# Patient Record
Sex: Male | Born: 1940 | Race: White
Health system: Southern US, Community
[De-identification: ages and names within clinical notes are randomized; demographics above are authoritative.]

## PROBLEM LIST (undated history)

## (undated) DIAGNOSIS — I447 Left bundle-branch block, unspecified: Secondary | ICD-10-CM

## (undated) DIAGNOSIS — E785 Hyperlipidemia, unspecified: Secondary | ICD-10-CM

## (undated) DIAGNOSIS — J40 Bronchitis, not specified as acute or chronic: Secondary | ICD-10-CM

## (undated) DIAGNOSIS — M545 Low back pain, unspecified: Secondary | ICD-10-CM

## (undated) DIAGNOSIS — K219 Gastro-esophageal reflux disease without esophagitis: Secondary | ICD-10-CM

## (undated) DIAGNOSIS — J302 Other seasonal allergic rhinitis: Secondary | ICD-10-CM

## (undated) DIAGNOSIS — I10 Essential (primary) hypertension: Secondary | ICD-10-CM

## (undated) DIAGNOSIS — K52831 Collagenous colitis: Secondary | ICD-10-CM

## (undated) DIAGNOSIS — B269 Mumps without complication: Secondary | ICD-10-CM

## (undated) DIAGNOSIS — Z9289 Personal history of other medical treatment: Secondary | ICD-10-CM

## (undated) DIAGNOSIS — E039 Hypothyroidism, unspecified: Secondary | ICD-10-CM

## (undated) DIAGNOSIS — M47816 Spondylosis without myelopathy or radiculopathy, lumbar region: Secondary | ICD-10-CM

## (undated) DIAGNOSIS — I429 Cardiomyopathy, unspecified: Secondary | ICD-10-CM

## (undated) DIAGNOSIS — E559 Vitamin D deficiency, unspecified: Secondary | ICD-10-CM

## (undated) DIAGNOSIS — F40240 Claustrophobia: Secondary | ICD-10-CM

## (undated) HISTORY — DX: Collagenous colitis: K52.831

## (undated) HISTORY — DX: Hyperlipidemia, unspecified: E78.5

## (undated) HISTORY — DX: Left bundle-branch block, unspecified: I44.7

## (undated) HISTORY — DX: Essential (primary) hypertension: I10

## (undated) HISTORY — DX: Mumps without complication: B26.9

## (undated) HISTORY — DX: Bronchitis, not specified as acute or chronic: J40

## (undated) HISTORY — DX: Personal history of other medical treatment: Z92.89

## (undated) HISTORY — DX: Other seasonal allergic rhinitis: J30.2

## (undated) HISTORY — DX: Gastro-esophageal reflux disease without esophagitis: K21.9

## (undated) HISTORY — DX: Hypothyroidism, unspecified: E03.9

## (undated) HISTORY — DX: Vitamin D deficiency, unspecified: E55.9

## (undated) HISTORY — DX: Cardiomyopathy, unspecified: I42.9

---
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
## 1994-03-07 ENCOUNTER — Ambulatory Visit: Admit: 1994-03-07 | Disposition: A | Discharge status: Home / Self Care | Payer: Self-pay | Admitting: Internal Medicine

## 1994-03-07 ENCOUNTER — Other Ambulatory Visit: Payer: Self-pay

## 1995-09-28 ENCOUNTER — Ambulatory Visit: Admit: 1995-09-28 | Disposition: A | Discharge status: Home / Self Care | Payer: Self-pay | Admitting: Family Medicine

## 1995-10-01 ENCOUNTER — Ambulatory Visit: Admit: 1995-10-01 | Disposition: A | Discharge status: Home / Self Care | Payer: Self-pay | Admitting: Internal Medicine

## 1996-01-04 ENCOUNTER — Ambulatory Visit: Admit: 1996-01-04 | Disposition: A | Discharge status: Home / Self Care | Payer: Self-pay | Admitting: Family Medicine

## 1996-03-12 ENCOUNTER — Ambulatory Visit: Admit: 1996-03-12 | Disposition: A | Discharge status: Home / Self Care | Payer: Self-pay | Admitting: Family Medicine

## 1996-03-20 ENCOUNTER — Ambulatory Visit
Admit: 1996-03-20 | Disposition: A | Discharge status: Home / Self Care | Payer: Self-pay | Admitting: Physical Medicine & Rehabilitation

## 1996-07-29 ENCOUNTER — Ambulatory Visit: Admit: 1996-07-29 | Disposition: A | Discharge status: Home / Self Care | Payer: Self-pay

## 1997-01-01 ENCOUNTER — Ambulatory Visit: Admit: 1997-01-01 | Disposition: A | Discharge status: Home / Self Care | Payer: Self-pay

## 1997-03-16 ENCOUNTER — Ambulatory Visit
Admit: 1997-03-16 | Disposition: A | Discharge status: Home / Self Care | Payer: Self-pay | Admitting: Physical Medicine & Rehabilitation

## 2001-03-31 ENCOUNTER — Ambulatory Visit
Admit: 2001-03-31 | Disposition: A | Discharge status: Home / Self Care | Payer: Self-pay | Admitting: Physical Medicine & Rehabilitation

## 2010-09-05 HISTORY — PX: CARDIAC CATHETERIZATION: SHX172

## 2010-09-12 ENCOUNTER — Ambulatory Visit
Admission: RE | Admit: 2010-09-12 | Discharge: 2010-09-12 | Disposition: A | Discharge status: Home / Self Care | Payer: Self-pay | Source: Ambulatory Visit | Attending: Internal Medicine | Admitting: Internal Medicine

## 2011-03-24 NOTE — Op Note (Signed)
Wesley [name], Wesley [name]      MRN:          [id number]      Account:      [id number]      Document ID:  [id number]      Procedure Date: 09/12/2010            Admit Date: 09/12/2010      Order #:            Patient Location: DISCHARGED 09/12/2010      Patient Type: A            ATTENDING PHYSICIAN: Orland Dec, MD            PROCEDURE PERFORMED BY: Delton Coombes MD                  REFERRING PHYSICIAN:      Torrie Mayers MD            TITLE OF PROCEDURE:      Left heart catheterization, selective coronary arteriography, and left      ventriculography.            HISTORY:      The patient is a 70 year old male with history of hypertension and      hyperlipidemia, who came to me for a second opinion.  The patient had a      stress thallium study done a few months ago, which was reported to be      abnormal, showing ischemia involving the anterior and apical wall.  The      patient was advised to undergo cardiac catheterization, but he was      reluctant.  Thereafter, he came to me for evaluation.  The patient is not      having any discomfort, and so he was worried about having the procedure.      His initial EKG did not show any significant changes.  I explained to him      that the stress thallium was not definitive, and it could be false      positive.  Thereafter, he was advised to be on medications including a beta      blocker, aspirin, and antiplatelet agents.  The patient then came back for      followup and complained of having feeling fatigued.  His new EKG was no      change and different than his previous EKG, and showed new left bundle      branch block.  It was therefore decided to proceed with cardiac      catheterization to rule out coronary artery disease.  Risks and benefits of      the procedure were explained to the patient in detail.  He understands and      is willing to proceed.            DESCRIPTION OF PROCEDURE:      Informed consent was obtained.  The patient was brought to the      catheterization lab  and the right groin area was prepped and draped in the      usual sterile fashion.  Local anesthesia was achieved using 6 mL of 1%      Xylocaine.  A 4-French hemostasis sheath was inserted in the right femoral      artery using the modified Seldinger technique.  Selective coronary      arteriography of the left and right coronary arteries was done using  4-French JL4 and 3DRC catheters, respectively.  Left ventriculography was                                   Page 1 of 3      Wesley [name], Wesley [name]      MRN:          [id number]      Account:      [id number]      Document ID:  [id number]      Procedure Date: 09/12/2010            done using 4-French pigtail catheter and 30 mL of contrast.  Pullback      pressure across the aorta was noted.  The patient tolerated the procedure      very well.  He was given Versed and fentanyl for sedation.  At the end of      the procedure, the sheath was removed and hemostasis was achieved with the      application of manual pressure for about 20 minutes.  The patient was      transferred to the room in stable condition.            FINDINGS:      LMCA:  The left main coronary artery gives rise to the LAD and the      circumflex coronary artery in the usual fashion.  The left main is free of      any disease.            LAD:  The left anterior descending coronary artery gives rise to 3 diagonal      branches and 2 septal perforators before ending as a recurrent apical      branch.  The LAD system is also free of any significant disease.            CIRCUMFLEX:  The circumflex coronary artery gives rise to 2 obtuse marginal      branches and then continues as the AV groove vessel.  The circumflex system      is also free of any disease.            RCA:  The right coronary artery gives rise to an RV marginal branch, a PDA,      and a posterolateral branch.  The right coronary artery is also free of any      disease.            LEFT VENTRICULOGRAPHY:      Shows mild hypokinesis of the  anterior and apical wall, otherwise      well-preserved overall left ventricular systolic function.  EF is      approximately 40% to 45%.  No gradient is seen across the aortic valve.            CONCLUSIONS:      1.  Near normal coronary arteries.      2.  Mild left ventricular systolic dysfunction with inferior atrial wall      hypokinesis.  EF approximately 40% to 45%.            COMMENTS:      The patient does not have any coronary artery disease to account for his      symptoms.  His thallium results are false positive and may be due to left      bundle branch block.  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  This could be a result of viral infection, drug-induced or      idiopathic.  Also, this may have caused him to the develop left bundle      branch block.  Obviously he does not have any concern, since the patient is      not having any symptoms.  I would suggest to continue monitoring him.      Also, would suggest to continue treating his blood pressure and cholesterol      as before.                                   Page 2 of 3      Wesley [name], Wesley [name]      MRN:          [id number]      Account:      [id number]      Document ID:  [id number]      Procedure Date: 09/12/2010                  Thank you very much for asking me to evaluate this patient.                        Electronic Signing Provider            D:  09/13/2010 23:24 PM by Dr. Kathie [place] ([id number])      T:  09/13/2010 23:39 PM by [id number]                  GU:[email or web address] Everardo All MD                                   Page 3 of 3      Authenticated by Joya Gaskins, MD ([id number]) On 10/20/2010 10:20:06 AM

## 2014-02-16 ENCOUNTER — Encounter (INDEPENDENT_AMBULATORY_CARE_PROVIDER_SITE_OTHER): Payer: Self-pay | Admitting: Internal Medicine

## 2014-02-17 ENCOUNTER — Ambulatory Visit (INDEPENDENT_AMBULATORY_CARE_PROVIDER_SITE_OTHER): Payer: Medicare Other | Admitting: Internal Medicine

## 2014-02-17 VITALS — BP 156/82 | HR 92 | Ht 68.0 in | Wt 157.0 lb

## 2014-02-17 DIAGNOSIS — I447 Left bundle-branch block, unspecified: Secondary | ICD-10-CM

## 2014-02-17 NOTE — Progress Notes (Signed)
[facility] NEW OFFICE CONSULTATION      Referring: Torrie Mayers, MD  HPI:   Today, I had the pleasure of seeing Wesley [name] who is a 73 year old man  with a history of LBBB to approximately 2011.  He underwent cardiac  catheterization in 2012 due to questionable stress test.  His cardiac  catheterization was done at [facility] by Dr. Johnette Abraham and was within  normal limits.  There was a question about whether or not he had ischemia.   In any event, his catheterization showed coronary artery disease but LVEF  of approximately 55%.  He had other imaging modalities including an echo  1 year before, which was within normal limits.  He is very active.  He does  aerobics for half an hour to an hour essentially every day.  Furthermore,  he lifts weights and is involved in [employer] strength program on a regular  basis, except not at the current time as he hurt his right arm.  No  dizziness, lightheadedness, syncope.  No chest pain, arm pain, neck pain,  back pain.  No PND, orthopnea, or peripheral edema.     His electrocardiogram today shows sinus rhythm, left bundle branch block,  QRS 140 duration of 140 msec.          Assessment and Plan:  In summary, this 73 year old male with hyperlipidemia.  He has demonstrated  a left bundle branch block QRS on his electrocardiogram which is  approximately 72 years old.  I would recommend no specific intervention.  He  did have an LV on his angiogram of about 45% although I suspect that  that was due to it being a left ventriculogram with the contrast   reduced LV function.  My thought is that he should get a 2D echocardiogram  and return to see me in 1 month's time.  We will review those results and  decide what to do going forward.  My own thought is that he will not need  anything else.          PMH: No past medical history on file.     MEDICATIONS: He has a current medication list which includes the following prescription(s): atorvastatin - Take 20 mg by mouth daily, vitamin d  - Take by mouth, co q 10 - Take by mouth, diphenoxylate-atropine - Take 1 tablet by mouth 4 (four) times daily as needed for Diarrhea, ezetimibe - Take 10 mg by mouth daily, levothyroxine - Take 125 mcg by mouth Once a day at 6:00am, loperamide - Take 2 mg by mouth 4 (four) times daily as needed for Diarrhea, olmesartan - Take 10 mg by mouth daily. 4 days a week, olmesartan-hydrochlorothiazide - Take 0.5 tablets by mouth daily. 3 days a week, and testosterone - Place 5 g onto the skin 2 (two) times daily.    Current Outpatient Prescriptions   Medication Sig Dispense Refill   . atorvastatin (LIPITOR) 20 MG tablet Take 20 mg by mouth daily.     . Cholecalciferol (VITAMIN D) 2000 UNITS Cap Take by mouth.     . Coenzyme Q10 (CO Q 10) 10 MG Cap Take by mouth.     . diphenoxylate-atropine (LOMOTIL) 2.5-0.025 MG per tablet Take 1 tablet by mouth 4 (four) times daily as needed for Diarrhea.     . ezetimibe (ZETIA) 10 MG tablet Take 10 mg by mouth daily.     Marland Kitchen levothyroxine (LEVOXYL) 125 MCG tablet Take 125 mcg by mouth Once a  day at 6:00am.     . loperamide (IMODIUM) 2 MG capsule Take 2 mg by mouth 4 (four) times daily as needed for Diarrhea.     . olmesartan (BENICAR) 5 MG tablet Take 10 mg by mouth daily. 4 days a week.     . olmesartan-hydrochlorothiazide (BENICAR HCT) 20-12.5 MG per tablet Take 0.5 tablets by mouth daily. 3 days a week.     . testosterone (ANDROGEL) 50 MG/5GM (1%) Gel Place 5 g onto the skin 2 (two) times daily.       No current facility-administered medications for this visit.        SH:   History   Substance Use Topics   . Smoking status: Not on file   . Smokeless tobacco: Not on file   . Alcohol Use: Not on file       FH: no family history of sudden death    REVIEW OF SYSTEMS: All other systems reviewed and negative except as above.    PHYSICAL EXAMINATION  General Appearance: A well-appearing male in no acute distress.   Vital Signs: BP 156/82 mmHg  Pulse 92  Ht 1.727 m (5\' 8" )  Wt 71.215 kg (157  lb)  BMI 23.88 kg/m2   HEENT: Sclera anicteric, conjunctiva without pallor, moist mucous membranes, normal dentition.   Neck: Supple. Full range of motion.  Chest: Clear to auscultation. No wheezes, rales, or rhonchi  Cardiovascular: Normal S1 and S2 without murmurs, rub, or gallops.No edema. Pulses 2+ and equal. JVP <10 cm H2O  Abdomen: Soft, nontender. Nl bowel sounds  Extremities: Anicteric  Skin: Warm and dry  Neuro: Alert and oriented x3. Grossly intact. Strength is symmetric and moves all 4. Normal mood and affect.     ECG: Nl sinus rhythm. LBBB.    Cardiac Diagnostics:      Thanks.    ----------------------------  Edward Qualia, MD  [facility] - Arrhythmia   Office: [phone]

## 2014-02-23 ENCOUNTER — Encounter (INDEPENDENT_AMBULATORY_CARE_PROVIDER_SITE_OTHER): Payer: Self-pay | Admitting: Internal Medicine

## 2014-03-24 ENCOUNTER — Ambulatory Visit (INDEPENDENT_AMBULATORY_CARE_PROVIDER_SITE_OTHER): Payer: Medicare Other | Admitting: Internal Medicine

## 2015-03-22 DIAGNOSIS — E7800 Pure hypercholesterolemia, unspecified: Secondary | ICD-10-CM | POA: Insufficient documentation

## 2015-03-22 DIAGNOSIS — I10 Essential (primary) hypertension: Secondary | ICD-10-CM | POA: Insufficient documentation

## 2015-03-22 DIAGNOSIS — E039 Hypothyroidism, unspecified: Secondary | ICD-10-CM | POA: Insufficient documentation

## 2015-03-23 ENCOUNTER — Ambulatory Visit (INDEPENDENT_AMBULATORY_CARE_PROVIDER_SITE_OTHER): Payer: Self-pay | Admitting: Cardiology

## 2015-03-23 DIAGNOSIS — I425 Other restrictive cardiomyopathy: Secondary | ICD-10-CM | POA: Insufficient documentation

## 2015-03-23 DIAGNOSIS — I447 Left bundle-branch block, unspecified: Secondary | ICD-10-CM | POA: Insufficient documentation

## 2015-04-06 ENCOUNTER — Other Ambulatory Visit (INDEPENDENT_AMBULATORY_CARE_PROVIDER_SITE_OTHER): Payer: Self-pay

## 2015-04-06 ENCOUNTER — Ambulatory Visit (INDEPENDENT_AMBULATORY_CARE_PROVIDER_SITE_OTHER): Payer: Self-pay

## 2015-07-14 ENCOUNTER — Other Ambulatory Visit: Payer: Self-pay

## 2015-07-25 LAB — VAHRT HISTORIC LVEF: Ejection Fraction: 60 %

## 2015-07-26 ENCOUNTER — Ambulatory Visit (INDEPENDENT_AMBULATORY_CARE_PROVIDER_SITE_OTHER): Payer: Self-pay | Admitting: Cardiology

## 2015-07-28 DIAGNOSIS — M1712 Unilateral primary osteoarthritis, left knee: Secondary | ICD-10-CM | POA: Insufficient documentation

## 2015-07-28 DIAGNOSIS — M5417 Radiculopathy, lumbosacral region: Secondary | ICD-10-CM | POA: Insufficient documentation

## 2015-07-28 DIAGNOSIS — S76219A Strain of adductor muscle, fascia and tendon of unspecified thigh, initial encounter: Secondary | ICD-10-CM | POA: Insufficient documentation

## 2016-10-31 DIAGNOSIS — M25552 Pain in left hip: Secondary | ICD-10-CM | POA: Insufficient documentation

## 2016-10-31 DIAGNOSIS — E063 Autoimmune thyroiditis: Secondary | ICD-10-CM | POA: Insufficient documentation

## 2016-10-31 DIAGNOSIS — I10 Essential (primary) hypertension: Secondary | ICD-10-CM | POA: Insufficient documentation

## 2016-10-31 DIAGNOSIS — E785 Hyperlipidemia, unspecified: Secondary | ICD-10-CM | POA: Insufficient documentation

## 2016-10-31 DIAGNOSIS — F40240 Claustrophobia: Secondary | ICD-10-CM | POA: Insufficient documentation

## 2016-10-31 DIAGNOSIS — M48061 Spinal stenosis, lumbar region without neurogenic claudication: Secondary | ICD-10-CM | POA: Insufficient documentation

## 2016-11-02 DIAGNOSIS — M47816 Spondylosis without myelopathy or radiculopathy, lumbar region: Secondary | ICD-10-CM | POA: Insufficient documentation

## 2016-11-02 DIAGNOSIS — K219 Gastro-esophageal reflux disease without esophagitis: Secondary | ICD-10-CM | POA: Insufficient documentation

## 2016-11-06 ENCOUNTER — Ambulatory Visit (INDEPENDENT_AMBULATORY_CARE_PROVIDER_SITE_OTHER): Payer: Self-pay | Admitting: Cardiology

## 2017-06-04 ENCOUNTER — Other Ambulatory Visit (INDEPENDENT_AMBULATORY_CARE_PROVIDER_SITE_OTHER): Payer: Self-pay

## 2017-06-04 ENCOUNTER — Ambulatory Visit (INDEPENDENT_AMBULATORY_CARE_PROVIDER_SITE_OTHER): Payer: Self-pay

## 2017-06-10 LAB — VAHRT HISTORIC LVEF

## 2017-06-11 ENCOUNTER — Ambulatory Visit (INDEPENDENT_AMBULATORY_CARE_PROVIDER_SITE_OTHER): Payer: Self-pay | Admitting: Cardiology

## 2017-06-21 HISTORY — PX: COLONOSCOPY: SHX174

## 2017-06-28 ENCOUNTER — Ambulatory Visit (INDEPENDENT_AMBULATORY_CARE_PROVIDER_SITE_OTHER): Payer: Self-pay | Admitting: Physician Assistant

## 2017-07-02 DIAGNOSIS — R0781 Pleurodynia: Secondary | ICD-10-CM | POA: Insufficient documentation

## 2017-07-03 DIAGNOSIS — I429 Cardiomyopathy, unspecified: Secondary | ICD-10-CM | POA: Insufficient documentation

## 2017-07-03 DIAGNOSIS — I2510 Atherosclerotic heart disease of native coronary artery without angina pectoris: Secondary | ICD-10-CM | POA: Insufficient documentation

## 2017-07-12 ENCOUNTER — Ambulatory Visit (INDEPENDENT_AMBULATORY_CARE_PROVIDER_SITE_OTHER): Payer: Self-pay | Admitting: Physician Assistant

## 2017-10-08 ENCOUNTER — Other Ambulatory Visit (INDEPENDENT_AMBULATORY_CARE_PROVIDER_SITE_OTHER): Payer: Self-pay

## 2017-10-08 ENCOUNTER — Ambulatory Visit (INDEPENDENT_AMBULATORY_CARE_PROVIDER_SITE_OTHER): Payer: Self-pay

## 2017-10-22 DIAGNOSIS — K52831 Collagenous colitis: Secondary | ICD-10-CM | POA: Insufficient documentation

## 2017-11-05 LAB — VAHRT HISTORIC LVEF: Ejection Fraction: 50 %

## 2017-11-06 ENCOUNTER — Ambulatory Visit (INDEPENDENT_AMBULATORY_CARE_PROVIDER_SITE_OTHER): Payer: Self-pay | Admitting: Cardiology

## 2017-11-29 DIAGNOSIS — R110 Nausea: Secondary | ICD-10-CM | POA: Insufficient documentation

## 2017-11-29 DIAGNOSIS — M542 Cervicalgia: Secondary | ICD-10-CM | POA: Insufficient documentation

## 2018-01-20 DIAGNOSIS — E559 Vitamin D deficiency, unspecified: Secondary | ICD-10-CM | POA: Insufficient documentation

## 2018-04-16 DIAGNOSIS — F419 Anxiety disorder, unspecified: Secondary | ICD-10-CM | POA: Insufficient documentation

## 2018-04-16 DIAGNOSIS — M25511 Pain in right shoulder: Secondary | ICD-10-CM | POA: Insufficient documentation

## 2018-06-02 DIAGNOSIS — Z6825 Body mass index (BMI) 25.0-25.9, adult: Secondary | ICD-10-CM | POA: Insufficient documentation

## 2018-09-19 ENCOUNTER — Ambulatory Visit (INDEPENDENT_AMBULATORY_CARE_PROVIDER_SITE_OTHER): Payer: Self-pay

## 2018-09-19 ENCOUNTER — Other Ambulatory Visit (INDEPENDENT_AMBULATORY_CARE_PROVIDER_SITE_OTHER): Payer: Self-pay

## 2018-10-31 HISTORY — PX: EGD: SHX3789

## 2018-11-13 LAB — VAHRT HISTORIC LVEF: Ejection Fraction: 42 %

## 2018-11-14 ENCOUNTER — Ambulatory Visit (INDEPENDENT_AMBULATORY_CARE_PROVIDER_SITE_OTHER): Payer: Self-pay | Admitting: Cardiology

## 2018-11-26 ENCOUNTER — Encounter (INDEPENDENT_AMBULATORY_CARE_PROVIDER_SITE_OTHER): Payer: Self-pay | Admitting: Family Medicine

## 2018-12-05 ENCOUNTER — Encounter (INDEPENDENT_AMBULATORY_CARE_PROVIDER_SITE_OTHER): Payer: Self-pay

## 2018-12-25 ENCOUNTER — Encounter (INDEPENDENT_AMBULATORY_CARE_PROVIDER_SITE_OTHER): Payer: Self-pay | Admitting: Family Medicine

## 2018-12-26 ENCOUNTER — Telehealth (INDEPENDENT_AMBULATORY_CARE_PROVIDER_SITE_OTHER): Payer: Self-pay | Admitting: Family Medicine

## 2018-12-26 DIAGNOSIS — R7309 Other abnormal glucose: Secondary | ICD-10-CM

## 2018-12-26 DIAGNOSIS — D1803 Hemangioma of intra-abdominal structures: Secondary | ICD-10-CM | POA: Insufficient documentation

## 2018-12-26 DIAGNOSIS — M5090 Cervical disc disorder, unspecified, unspecified cervical region: Secondary | ICD-10-CM | POA: Insufficient documentation

## 2018-12-26 DIAGNOSIS — M79659 Pain in unspecified thigh: Secondary | ICD-10-CM | POA: Insufficient documentation

## 2018-12-26 DIAGNOSIS — M79603 Pain in arm, unspecified: Secondary | ICD-10-CM | POA: Insufficient documentation

## 2018-12-26 DIAGNOSIS — S76209A Unspecified injury of adductor muscle, fascia and tendon of unspecified thigh, initial encounter: Secondary | ICD-10-CM | POA: Insufficient documentation

## 2018-12-26 NOTE — Telephone Encounter (Signed)
Labs from 11/24/2018    Your cholesterol is in excellent range with LDL bad cholesterol and triglycerides values at goal and normal HDL good cholesterol.    Sodium, potassium, electrolytes, calcium,  kidney , liver normal.    Your creatine kinase muscle enzyme is in normal range.    Your TSH thyroid hormone levels are in normal range.    Your vitamin B12 level  is in normal range.    Glucose shows possible prediabetes, recheck fasting lab visit then office visit in 3 months.  I recommend reducing sugar, carb, sweets, white bread, pasta, rice in your diet. Please get regular exercise, at least 60 minutes a week of cardio including faster pace walking, running, cycling, or swimming. Can come back for lab visit or check at next visit, Hemoglobin A1C test which is to see how well sugar is controlled over last 3 months.

## 2019-01-14 NOTE — Telephone Encounter (Signed)
S/w pt and pt stated that he already received results.

## 2019-01-15 ENCOUNTER — Encounter (INDEPENDENT_AMBULATORY_CARE_PROVIDER_SITE_OTHER): Payer: Self-pay

## 2019-02-06 ENCOUNTER — Encounter (INDEPENDENT_AMBULATORY_CARE_PROVIDER_SITE_OTHER): Payer: Self-pay | Admitting: Family Medicine

## 2019-02-17 ENCOUNTER — Encounter (INDEPENDENT_AMBULATORY_CARE_PROVIDER_SITE_OTHER): Payer: Self-pay

## 2019-03-02 ENCOUNTER — Telehealth (INDEPENDENT_AMBULATORY_CARE_PROVIDER_SITE_OTHER): Payer: Self-pay | Admitting: Family Medicine

## 2019-03-02 NOTE — Telephone Encounter (Signed)
Pt came by to ask if you would re do the Urology referral so that it has Dr.Amer Al Juburi listed as the Urologist. Pt is anxious to make appointment.

## 2019-03-02 NOTE — Telephone Encounter (Signed)
For what diagnosis?

## 2019-03-04 NOTE — Telephone Encounter (Signed)
Please obtain information needed for refill

## 2019-03-17 ENCOUNTER — Encounter (INDEPENDENT_AMBULATORY_CARE_PROVIDER_SITE_OTHER): Payer: Self-pay | Admitting: Family Medicine

## 2019-04-10 ENCOUNTER — Ambulatory Visit (INDEPENDENT_AMBULATORY_CARE_PROVIDER_SITE_OTHER): Payer: Medicare Other | Admitting: Family Medicine

## 2019-04-12 ENCOUNTER — Encounter (INDEPENDENT_AMBULATORY_CARE_PROVIDER_SITE_OTHER): Payer: Self-pay

## 2019-04-12 NOTE — Progress Notes (Signed)
[facility]                       Date of Exam: 04/13/2019 1:43 PM        Patient ID: Wesley [name] is a 78 y.o. male.  Attending Physician: Maebelle Munroe, DO        Chief Complaint:    Chief Complaint   Patient presents with   . Hypothyroidism   . Hypertension               HPI:    Pt presents today for ov  6 month f/u   Discuss hemidrosis and would like cream   Bp at home 110's/70's      Here complaining of hemorrhoids, does have blood when wiping at times.  Will be seeing GI soon again.  colonoscopy UTD last year pt believes.  Had recent rectal exam by urology as well.      Pt also following up on htn, bp runs 120-130/60-70s at home.  Does well with watching salt in diet.  Exercising a few times a week, no chest pain/sob.  On coreg, benicar and benicar hctz by cardiology.      Pt also has high cholesterol, on zetia and liptor without muscle aches.  Watches fat in diet well.    Pt also follow up on anxiety, uses xanax less than once a week but does feel jumpy at times with possible tremor.  Pt will see his neurologist soon.            Problem List:    Patient Active Problem List   Diagnosis   . LBBB (left bundle branch block)   . Anxiety   . Benign essential hypertension   . Cervical disc disorder   . Claustrophobia   . Hemangioma of liver   . Hyperlipidemia LDL goal <70   . Other specified hypothyroidism   . Injury of adductor muscle and tendon of thigh   . Nausea   . Neck pain   . Pain in joint of right shoulder   . Pain in upper limb   . Thigh pain   . Vitamin D deficiency   . Abnormal glucose   . Cardiomyopathy   . Collagenous colitis   . Costochondritis   . Degenerative lumbar spinal stenosis   . Gastroesophageal reflux disease without esophagitis   . Left hip pain   . Localized osteoarthritis of left knee   . Nonobstructive atherosclerosis of coronary artery   . Spondylosis of lumbar spine   . Strain of adductor magnus muscle   . Lumbosacral radiculopathy   .  Tremor of both hands   . Other specified anemias   . Tinea pedis of right foot   . Chronic pain of left knee   . Upper respiratory tract infection, unspecified type   . Other hemorrhoids             Current Meds:    Outpatient Medications Marked as Taking for the 04/13/19 encounter (Office Visit) with Maebelle Munroe, DO   Medication Sig Dispense Refill   . carvedilol (Coreg) 6.25 MG tablet Coreg 6.25 mg tablet   Take 1 tablet twice a day by oral route.     . Cholecalciferol (VITAMIN D) 2000 UNITS Cap Take by mouth.     . Coenzyme Q10 (CO Q 10) 10 MG Cap Take by mouth.     . diclofenac (VOLTAREN) 75  MG EC tablet diclofenac sodium 75 mg tablet,delayed release     . diphenoxylate-atropine (LOMOTIL) 2.5-0.025 MG per tablet Take 1 tablet by mouth 4 (four) times daily as needed for Diarrhea.     . olmesartan (BENICAR) 20 MG tablet Take 1 tablet (20 mg total) by mouth daily 4 days a week. 30 tablet 0   . olmesartan-hydrochlorothiazide (BENICAR HCT) 20-12.5 MG per tablet Take 1 tablet by mouth daily 3 days a week. 30 tablet 3   . ondansetron (ZOFRAN) 4 MG tablet every 8 (eight) hours     . RABEprazole (ACIPHEX) 20 MG tablet rabeprazole 20 mg tablet,delayed release     . testosterone (ANDROGEL) 50 MG/5GM (1%) Gel Place 5 g onto the skin 2 (two) times daily.     . [DISCONTINUED] ALPRAZolam (Xanax XR) 1 MG 24 hr tablet every 24 hours     . [DISCONTINUED] atorvastatin (LIPITOR) 20 MG tablet Take 20 mg by mouth daily.     . [DISCONTINUED] ezetimibe (ZETIA) 10 MG tablet Take 10 mg by mouth daily.     . [DISCONTINUED] levothyroxine (LEVOXYL) 125 MCG tablet Take 125 mcg by mouth Once a day at 6:00am.     . [DISCONTINUED] olmesartan (BENICAR) 5 MG tablet Take 10 mg by mouth daily. 4 days a week.     . [DISCONTINUED] olmesartan-hydrochlorothiazide (BENICAR HCT) 20-12.5 MG per tablet Take 0.5 tablets by mouth daily. 3 days a week.            Allergies:    No Known Allergies          Past Surgical History:    Past Surgical History:    Procedure Laterality Date   . COLONOSCOPY  06/21/2017   . EGD  10/31/2018    Dr. Rosalyn Charters- erythema   . TONSILLECTOMY  05/07/1943           Family History:    Family History   Problem Relation Age of Onset   . Migraines Mother    . Stomach cancer Sister         Died age 9   . Thyroid disease Sister 71   . Colon cancer Other         Cousin           Social History:    Social History     Tobacco Use   . Smoking status: Former Smoker     Packs/day: 1.00   . Smokeless tobacco: Never Used   Substance Use Topics   . Alcohol Use     Frequency: Never   . Drug use: Not on file          The following sections were reviewed this encounter by the provider:   Tobacco  Allergies  Meds  Problems  Med Hx  Surg Hx  Fam Hx             Vital Signs:    BP 140/80 (BP Site: Left arm, Patient Position: Sitting)   Pulse 78   Temp 97.1 F (36.2 C) (Temporal)   Resp 16   Ht 1.727 m (5\' 8" )   Wt 70.8 kg (156 lb)   SpO2 98%   BMI 23.72 kg/m          ROS:    Review of Systems   Constitutional: Negative for chills, diaphoresis, fatigue and fever.   HENT: Negative for congestion, ear pain, postnasal drip, rhinorrhea, sinus pressure and sore throat.    Eyes: Negative for photophobia,  discharge, redness and itching.   Respiratory: Negative for cough, chest tightness, shortness of breath and wheezing.    Cardiovascular: Negative for chest pain, palpitations and leg swelling.   Gastrointestinal: Negative for abdominal pain, blood in stool, constipation, diarrhea, nausea and vomiting.   Endocrine: Negative for cold intolerance, heat intolerance, polydipsia, polyphagia and polyuria.   Genitourinary: Negative for dysuria, flank pain, frequency, hematuria and urgency.   Musculoskeletal: Negative for arthralgias, back pain and myalgias.   Skin: Positive for rash (right foot). Negative for pallor.   Neurological: Positive for tremors. Negative for weakness, light-headedness, numbness and headaches.   Hematological: Negative for  adenopathy. Does not bruise/bleed easily.   Psychiatric/Behavioral: Negative for dysphoric mood, hallucinations, self-injury, sleep disturbance and suicidal ideas. The patient is nervous/anxious. The patient is not hyperactive.    All other systems reviewed and are negative.             Physical Exam:    Physical Exam  Vitals signs and nursing note reviewed.   Constitutional:       General: He is not in acute distress.     Appearance: Normal appearance. He is not ill-appearing, toxic-appearing or diaphoretic.   HENT:      Head: Normocephalic and atraumatic.   Eyes:      General: No scleral icterus.        Right eye: No discharge.         Left eye: No discharge.      Extraocular Movements: Extraocular movements intact.      Conjunctiva/sclera: Conjunctivae normal.      Pupils: Pupils are equal, round, and reactive to light.   Neck:      Musculoskeletal: Neck supple. No muscular tenderness.   Cardiovascular:      Rate and Rhythm: Normal rate and regular rhythm.      Heart sounds: Normal heart sounds. No murmur. No gallop.    Pulmonary:      Effort: Pulmonary effort is normal. No respiratory distress.      Breath sounds: Normal breath sounds. No wheezing or rales.   Chest:      Chest wall: No tenderness.   Abdominal:      General: Abdomen is flat. There is no distension.      Palpations: Abdomen is soft. There is no mass.      Tenderness: There is no abdominal tenderness. There is no right CVA tenderness, left CVA tenderness, guarding or rebound.      Hernia: No hernia is present.   Genitourinary:     Comments: No external hemorrhoid present, pt defer internal exam  Musculoskeletal:      Right lower leg: No edema.      Left lower leg: No edema.   Lymphadenopathy:      Cervical: No cervical adenopathy.   Skin:     Coloration: Skin is not pale.      Findings: Rash (macular dry scaly rash plantar aspect right foot and in between toes) present.   Neurological:      General: No focal deficit present.      Mental Status: He  is alert and oriented to person, place, and time.      Cranial Nerves: No cranial nerve deficit.      Motor: No weakness.      Comments: Mild resting tremor both hands   Psychiatric:         Mood and Affect: Mood normal.         Behavior: Behavior  normal.         Thought Content: Thought content normal.         Judgment: Judgment normal.              Assessment:    1. Other hemorrhoids  - hydrocortisone (Proctosol HC) 2.5 % rectal cream; Place rectally 2 (two) times daily  Dispense: 28 g; Refill: 1    2. Tremor of both hands    3. Benign essential hypertension    4. Hyperlipidemia LDL goal <70  - atorvastatin (LIPITOR) 20 MG tablet; Take 1 tablet (20 mg total) by mouth daily  Dispense: 90 tablet; Refill: 1  - ezetimibe (ZETIA) 10 MG tablet; Take 1 tablet (10 mg total) by mouth daily  Dispense: 90 tablet; Refill: 1  - Comprehensive metabolic panel; Future  - Lipid panel; Future  - Creatine Kinase (CK); Future    5. Anxiety  - ALPRAZolam (Xanax XR) 1 MG 24 hr tablet; Take 1 tablet (1 mg total) by mouth daily as needed (anxiety)  Dispense: 30 tablet; Refill: 0    6. Other specified hypothyroidism  - levothyroxine (Levoxyl) 125 MCG tablet; Take 1 tablet (125 mcg total) by mouth Once a day at 6:00am  Dispense: 90 tablet; Refill: 1  - TSH, Abn Reflex to Free T4, Serum; Future    7. Tinea pedis of right foot  - clotrimazole-betamethasone (LOTRISONE) cream; Apply to affected area 2 times daily  Dispense: 45 g; Refill: 1    8. Chronic pain of left knee  - lidocaine (Lidoderm) 5 %; Apply 1 patch for up to 12 hours a day, Remove & Discard patch within 12 hours  Dispense: 30 patch; Refill: 3    9. Nonobstructive atherosclerosis of coronary artery    10. Gastroesophageal reflux disease without esophagitis  - Magnesium; Future  - Vitamin B12; Future    11. Abnormal glucose  - Hemoglobin A1C; Future    12. Vitamin D deficiency  - Vitamin D,25 OH, Total; Future    13. Upper respiratory tract infection, unspecified type  -  SARS-CoV-2 Antibody, IgG; Future    14. Other specified anemias  - CBC and differential; Future  - IRON PROFILE; Future  - Ferritin; Future    15. Other general symptoms and signs   - Vitamin B12; Future    16. Abnormal finding of blood chemistry, unspecified   - IRON PROFILE; Future  - Ferritin; Future            Plan:      Other specified anemias  A/P: Will recheck labs, further management pending lab results.  Patient to continue to follow up with GI specialist.    Benign essential hypertension  A/P: slightly high today, continue current meds, f/u with cardiology    Hyperlipidemia LDL goal <70  A/P: pt will go to lab to get repeat labs done    Nonobstructive atherosclerosis of coronary artery  A/P: Stable, continue current treatment regimen.   Patient to continue to follow up with Cardiologist specialist.  Go to ER if increase chest pain or shortness of breath.    Upper respiratory tract infection, unspecified type  A/P: resolved.   Discussed with pt risk and benefits of Covid 19 antibody testing including limitations as to whether it may show immunity. Explained of symptoms to monitor for including fever, worsening cough, shortness of breath and abdominal symptoms including nausea and diarrhea. patient to go to ER if worsening symptoms.    Gastroesophageal reflux disease without  esophagitis  A/P: Patient to continue to follow up with GI specialist.    Other specified hypothyroidism  A/P: Will recheck labs, further management pending lab results.    Tinea pedis of right foot  A/P: treat with lotrisone    Abnormal glucose  A/P: Will recheck labs, further management pending lab results.    Anxiety  A/P: Stable, continue current treatment regimen.  Discussed with patient that medication can be addictive and is a controlled substance, patient understands and is agreeable to be careful with use.    Chronic pain of left knee  A/P: due to OA.  Stable, continue current treatment regimen.    Tremor of both hands  A/P: ?  Essential tremor.   Patient to continue to follow up with Neurologist Specialist.   Patient advised to go to ER if any neurologic changes including weakness, slur speech, worst headache of life or worsening symptoms.    Vitamin D deficiency  A/P: Patient advised that vitamin D test may not get covered by insurance. Benefits of vitamin d testing explained.    Other hemorrhoids  A/P: treat with proctosol.  Patient to continue to follow up with GI specialist.  Patient advised to go to ER if increasing abdominal pain, fever, vomiting, black stools or rectal bleeding.  Does have hx of blood in stool with wiping             Follow-up:    Return in about 4 months (around 08/12/2019), or if symptoms worsen or fail to improve, for HTN, High Cholesterol.         Maebelle Munroe, DO

## 2019-04-13 ENCOUNTER — Encounter (INDEPENDENT_AMBULATORY_CARE_PROVIDER_SITE_OTHER): Payer: Self-pay | Admitting: Family Medicine

## 2019-04-13 ENCOUNTER — Ambulatory Visit (INDEPENDENT_AMBULATORY_CARE_PROVIDER_SITE_OTHER): Payer: Medicare Other | Admitting: Family Medicine

## 2019-04-13 VITALS — BP 140/80 | HR 78 | Temp 97.1°F | Resp 16 | Ht 68.0 in | Wt 156.0 lb

## 2019-04-13 DIAGNOSIS — G8929 Other chronic pain: Secondary | ICD-10-CM

## 2019-04-13 DIAGNOSIS — E785 Hyperlipidemia, unspecified: Secondary | ICD-10-CM

## 2019-04-13 DIAGNOSIS — K219 Gastro-esophageal reflux disease without esophagitis: Secondary | ICD-10-CM

## 2019-04-13 DIAGNOSIS — K648 Other hemorrhoids: Secondary | ICD-10-CM | POA: Insufficient documentation

## 2019-04-13 DIAGNOSIS — F419 Anxiety disorder, unspecified: Secondary | ICD-10-CM

## 2019-04-13 DIAGNOSIS — D6489 Other specified anemias: Secondary | ICD-10-CM

## 2019-04-13 DIAGNOSIS — R6889 Other general symptoms and signs: Secondary | ICD-10-CM

## 2019-04-13 DIAGNOSIS — E038 Other specified hypothyroidism: Secondary | ICD-10-CM

## 2019-04-13 DIAGNOSIS — E559 Vitamin D deficiency, unspecified: Secondary | ICD-10-CM

## 2019-04-13 DIAGNOSIS — I10 Essential (primary) hypertension: Secondary | ICD-10-CM

## 2019-04-13 DIAGNOSIS — I2510 Atherosclerotic heart disease of native coronary artery without angina pectoris: Secondary | ICD-10-CM

## 2019-04-13 DIAGNOSIS — R7309 Other abnormal glucose: Secondary | ICD-10-CM

## 2019-04-13 DIAGNOSIS — R799 Abnormal finding of blood chemistry, unspecified: Secondary | ICD-10-CM

## 2019-04-13 DIAGNOSIS — R251 Tremor, unspecified: Secondary | ICD-10-CM | POA: Insufficient documentation

## 2019-04-13 DIAGNOSIS — M25562 Pain in left knee: Secondary | ICD-10-CM | POA: Insufficient documentation

## 2019-04-13 DIAGNOSIS — B353 Tinea pedis: Secondary | ICD-10-CM | POA: Insufficient documentation

## 2019-04-13 DIAGNOSIS — D509 Iron deficiency anemia, unspecified: Secondary | ICD-10-CM | POA: Insufficient documentation

## 2019-04-13 DIAGNOSIS — J069 Acute upper respiratory infection, unspecified: Secondary | ICD-10-CM | POA: Insufficient documentation

## 2019-04-13 MED ORDER — LEVOTHYROXINE SODIUM 125 MCG PO TABS
125.0000 ug | ORAL_TABLET | Freq: Every day | ORAL | 1 refills | Status: DC
Start: 2019-04-13 — End: 2019-06-05

## 2019-04-13 MED ORDER — ATORVASTATIN CALCIUM 20 MG PO TABS
20.0000 mg | ORAL_TABLET | Freq: Every day | ORAL | 1 refills | Status: DC
Start: 2019-04-13 — End: 2019-06-09

## 2019-04-13 MED ORDER — EZETIMIBE 10 MG PO TABS
10.0000 mg | ORAL_TABLET | Freq: Every day | ORAL | 1 refills | Status: DC
Start: 2019-04-13 — End: 2019-10-08

## 2019-04-13 MED ORDER — CLOTRIMAZOLE-BETAMETHASONE 1-0.05 % EX CREA
TOPICAL_CREAM | CUTANEOUS | 1 refills | Status: DC
Start: 2019-04-13 — End: 2019-06-05

## 2019-04-13 MED ORDER — OLMESARTAN MEDOXOMIL 20 MG PO TABS
20.0000 mg | ORAL_TABLET | Freq: Every day | ORAL | 0 refills | Status: DC
Start: 2019-04-13 — End: 2019-12-28

## 2019-04-13 MED ORDER — ALPRAZOLAM ER 1 MG PO TB24
1.00 mg | ORAL_TABLET | Freq: Every day | ORAL | 0 refills | Status: DC | PRN
Start: 2019-04-13 — End: 2019-06-05

## 2019-04-13 MED ORDER — LIDOCAINE 5 % EX PTCH
MEDICATED_PATCH | CUTANEOUS | 3 refills | Status: DC
Start: 2019-04-13 — End: 2019-11-02

## 2019-04-13 MED ORDER — ALPRAZOLAM ER 1 MG PO TB24
1.00 mg | ORAL_TABLET | Freq: Every day | ORAL | 0 refills | Status: DC | PRN
Start: 2019-04-13 — End: 2019-04-13

## 2019-04-13 MED ORDER — HYDROCORTISONE (PERIANAL) 2.5 % EX CREA
TOPICAL_CREAM | Freq: Two times a day (BID) | CUTANEOUS | 1 refills | Status: DC
Start: 2019-04-13 — End: 2019-06-05

## 2019-04-13 MED ORDER — OLMESARTAN MEDOXOMIL-HCTZ 20-12.5 MG PO TABS
1.0000 | ORAL_TABLET | Freq: Every day | ORAL | 3 refills | Status: DC
Start: 2019-04-13 — End: 2019-12-28

## 2019-04-13 NOTE — Assessment & Plan Note (Signed)
A/P: Stable, continue current treatment regimen.  Discussed with patient that medication can be addictive and is a controlled substance, patient understands and is agreeable to be careful with use.

## 2019-04-13 NOTE — Assessment & Plan Note (Signed)
A/P: resolved.   Discussed with pt risk and benefits of Covid 19 antibody testing including limitations as to whether it may show immunity. Explained of symptoms to monitor for including fever, worsening cough, shortness of breath and abdominal symptoms including nausea and diarrhea. patient to go to ER if worsening symptoms.

## 2019-04-13 NOTE — Assessment & Plan Note (Addendum)
A/P: Will recheck labs, further management pending lab results.  Patient to continue to follow up with GI specialist.

## 2019-04-13 NOTE — Assessment & Plan Note (Signed)
A/P: treat with lotrisone

## 2019-04-13 NOTE — Assessment & Plan Note (Signed)
A/P: due to OA.  Stable, continue current treatment regimen.

## 2019-04-13 NOTE — Assessment & Plan Note (Signed)
A/P: Patient advised that vitamin D test may not get covered by insurance. Benefits of vitamin d testing explained.

## 2019-04-13 NOTE — Assessment & Plan Note (Signed)
A/P: slightly high today, continue current meds, f/u with cardiology

## 2019-04-13 NOTE — Assessment & Plan Note (Signed)
A/P: Will recheck labs, further management pending lab results.

## 2019-04-13 NOTE — Assessment & Plan Note (Signed)
A/P: pt will go to lab to get repeat labs done

## 2019-04-13 NOTE — Assessment & Plan Note (Signed)
A/P: Patient to continue to follow up with GI specialist.

## 2019-04-13 NOTE — Patient Instructions (Signed)
Hemorrhoids    Hemorrhoids are swollen and inflamed veins inside the rectum and near the anus. The rectum is the last several inches of the colon. The anus is the passage between the rectum and the outside of the body.   Causes  The veins can become swollen due to increased pressure in them. This is most often caused by:    Chronic constipation or diarrhea   Straining when having a bowel movement   Sitting too long on the toilet   A low-fiber diet   Pregnancy  Symptoms   Bleeding from the rectum. You may notice this after bowel movements.   Lump near the anus   Itching around the anus   Pain around the anus   Mucus leaks from the anus  There are different types of hemorrhoids. Depending on the type you have and the severity, you may be able to treat yourself at home. In some cases, a procedure may be the best treatment option. Your healthcare provider can tell you more about this, if needed.   Home care  General care   To get relief from pain or itching, try:  ? Medicines. Your healthcare provider may recommend stool softeners, suppositories, or laxatives to help manage constipation. Use these exactly as directed.  ? Sitz baths. A sitz bath involves sitting in a few inches of warm bath water. Be careful not to make the water so hot that you burn yourself--test it before sitting in it. Soak for about 10 to 15 minutes a few times a day. This may help relieve pain.  ? Topical products. Your healthcare provider may prescribe or recommend creams, ointments, or pads that can be applied to the hemorrhoid. Use these exactly as directed.  Tips to help prevent hemorrhoids    Eat more fiber. Fiber adds bulk to stool and absorbs water as it moves through your colon. This makes stool softer and easier to pass.  ? Increase the fiber in your diet with more fiber-rich foods. These include fresh fruit, vegetables, and whole grains.  ? Take a fiber supplement or bulking agent, if advised by your healthcare provider.  These include products such as psyllium or methylcellulose.   Drinkmore water. Your healthcare provider may direct you to drink plenty of water. This can help keep stool soft.   Be more active. Frequent exercise aids digestion and helps prevent constipation. It may also help make bowel movements more regular.   Don't strain during bowel movements. This can make hemorrhoids more likely. Also, don't sit on the toilet for long periods of time.    Follow-up care  Follow up with your healthcare provider as advised. If a culture or imaging tests were done, someone will let you know the results when they are ready. This may take a few days or longer.If your healthcare provider recommends a procedure for your hemorrhoids, these options can be discussed. Options may include surgery and outpatient office treatments.   When to seek medical advice  Call your healthcare provider right away if any of these occur:    Increased bleeding from the rectum   Increased pain around the rectum or anus   Weakness or dizziness  Call 911  Call 911if any of these occur:    Trouble breathing or swallowing   Fainting or loss of consciousness   Unusually fast heart rate   Vomiting blood   Large amounts of blood in stool or black, tarry stools  [employer] last reviewed this educational content   on 12/05/2017   2000-2020 [employer] [address], [place] [address]. All rights reserved. This information is not intended as a substitute for professional medical care. Always follow your healthcare professional's instructions.

## 2019-04-13 NOTE — Assessment & Plan Note (Signed)
A/P: treat with proctosol.  Patient to continue to follow up with GI specialist.  Patient advised to go to ER if increasing abdominal pain, fever, vomiting, black stools or rectal bleeding.  Does have hx of blood in stool with wiping

## 2019-04-13 NOTE — Assessment & Plan Note (Signed)
A/P: ? Essential tremor.   Patient to continue to follow up with Neurologist Specialist.   Patient advised to go to ER if any neurologic changes including weakness, slur speech, worst headache of life or worsening symptoms.

## 2019-04-13 NOTE — Assessment & Plan Note (Signed)
A/P: Stable, continue current treatment regimen.   Patient to continue to follow up with Cardiologist specialist.  Go to ER if increase chest pain or shortness of breath.

## 2019-04-21 ENCOUNTER — Ambulatory Visit (INDEPENDENT_AMBULATORY_CARE_PROVIDER_SITE_OTHER): Payer: Self-pay | Admitting: Nurse Practitioner

## 2019-04-29 ENCOUNTER — Encounter (INDEPENDENT_AMBULATORY_CARE_PROVIDER_SITE_OTHER): Payer: Self-pay | Admitting: Family Medicine

## 2019-05-11 ENCOUNTER — Encounter (INDEPENDENT_AMBULATORY_CARE_PROVIDER_SITE_OTHER): Payer: Self-pay | Admitting: Family Medicine

## 2019-05-11 DIAGNOSIS — R7309 Other abnormal glucose: Secondary | ICD-10-CM

## 2019-05-11 DIAGNOSIS — J069 Acute upper respiratory infection, unspecified: Secondary | ICD-10-CM

## 2019-05-11 DIAGNOSIS — E559 Vitamin D deficiency, unspecified: Secondary | ICD-10-CM

## 2019-05-11 DIAGNOSIS — E785 Hyperlipidemia, unspecified: Secondary | ICD-10-CM

## 2019-05-11 DIAGNOSIS — E038 Other specified hypothyroidism: Secondary | ICD-10-CM

## 2019-05-14 ENCOUNTER — Encounter (INDEPENDENT_AMBULATORY_CARE_PROVIDER_SITE_OTHER): Payer: Self-pay | Admitting: Family Medicine

## 2019-05-14 DIAGNOSIS — D6489 Other specified anemias: Secondary | ICD-10-CM

## 2019-05-14 DIAGNOSIS — R6889 Other general symptoms and signs: Secondary | ICD-10-CM

## 2019-05-14 DIAGNOSIS — K219 Gastro-esophageal reflux disease without esophagitis: Secondary | ICD-10-CM

## 2019-05-14 DIAGNOSIS — R799 Abnormal finding of blood chemistry, unspecified: Secondary | ICD-10-CM

## 2019-06-03 ENCOUNTER — Encounter (INDEPENDENT_AMBULATORY_CARE_PROVIDER_SITE_OTHER): Payer: Self-pay | Admitting: Family Medicine

## 2019-06-03 NOTE — Progress Notes (Signed)
Signed, will fax back

## 2019-06-04 NOTE — Progress Notes (Signed)
[facility] [place] [facility]                       Date of Exam: 06/05/2019 5:29 PM        Patient ID: Wesley [name] is a 79 y.o. male.  Attending Physician: Maebelle Munroe, DO        Chief Complaint:    Chief Complaint   Patient presents with   . Skin Condition               HPI:    Here for follow up on 02/13/2019 right 4th digit trigger finger, done by Dr. Arnette Norris from [facility].  Been back to see Dr. Arnette Norris twice and doing PT.  Now with persistent pain in area with mild swelling in area.      Also has lump on left scalp area for 20 years, no pain or change in size.    When pt wakes up in morning, feels nasal congestion in morning time while in [place], had [facility] checked for mold.  Symptoms better here.  Using guaifenesin.   No cough/sob.  No taste/smell changes.  No nausea/diarrhea.  No unusual rash or fever.  Does have some left upper teeth pain.      Also follow up on anxiety, using xanax about 3-4 times a week which works well.  No depression symptoms.              Problem List:    Patient Active Problem List   Diagnosis   . LBBB (left bundle branch block)   . Anxiety   . Benign essential hypertension   . Cervical disc disorder   . Claustrophobia   . Hemangioma of liver   . Hyperlipidemia LDL goal <70   . Other specified hypothyroidism   . Injury of adductor muscle and tendon of thigh   . Nausea   . Neck pain   . Pain in joint of right shoulder   . Pain in upper limb   . Thigh pain   . Vitamin D deficiency   . Abnormal glucose   . Cardiomyopathy   . Collagenous colitis   . Costochondritis   . Degenerative lumbar spinal stenosis   . Gastroesophageal reflux disease without esophagitis   . Left hip pain   . Localized osteoarthritis of left knee   . Nonobstructive atherosclerosis of coronary artery   . Spondylosis of lumbar spine   . Strain of adductor magnus muscle   . Lumbosacral radiculopathy   . Tremor of both hands   . Other specified anemias   . Tinea pedis of right foot   . Chronic  pain of left knee   . Other hemorrhoids   . 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 Sebaceous cyst   . Acute non-recurrent maxillary sinusitis             Current Meds:    Outpatient Medications Marked as Taking for the 06/05/19 encounter (Office Visit) with Maebelle Munroe, DO   Medication Sig Dispense Refill   . ALPRAZolam (Xanax XR) 1 MG 24 hr tablet Take 1 tablet (1 mg total) by mouth daily as needed (anxiety) 30 tablet 0   . atorvastatin (LIPITOR) 20 MG tablet Take 1 tablet (20 mg total) by mouth daily 90 tablet 1   . carvedilol (Coreg) 6.25 MG tablet Coreg 6.25 mg tablet   Take 1 tablet twice a day by oral  route.     . Cholecalciferol (VITAMIN D) 2000 UNITS Cap Take by mouth.     . clotrimazole-betamethasone (LOTRISONE) cream Apply to affected area 2 times daily 45 g 1   . Coenzyme Q10 (CO Q 10) 10 MG Cap Take by mouth.     . diphenoxylate-atropine (LOMOTIL) 2.5-0.025 MG per tablet Take 1 tablet by mouth 4 (four) times daily as needed for Diarrhea.     . ezetimibe (ZETIA) 10 MG tablet Take 1 tablet (10 mg total) by mouth daily 90 tablet 1   . hydrocortisone (Proctosol HC) 2.5 % rectal cream Place rectally 2 (two) times daily 28 g 1   . Levoxyl 125 MCG tablet Take 1 tablet (125 mcg total) by mouth Once a day at 6:00am 90 tablet 1   . lidocaine (Lidoderm) 5 % Apply 1 patch for up to 12 hours a day, Remove & Discard patch within 12 hours 30 patch 3   . loperamide (IMODIUM) 2 MG capsule Take 2 mg by mouth 4 (four) times daily as needed for Diarrhea.     . olmesartan (BENICAR) 20 MG tablet Take 1 tablet (20 mg total) by mouth daily 4 days a week. 30 tablet 0   . olmesartan-hydrochlorothiazide (BENICAR HCT) 20-12.5 MG per tablet Take 1 tablet by mouth daily 3 days a week. 30 tablet 3   . ondansetron (ZOFRAN) 4 MG tablet every 8 (eight) hours     . RABEprazole (ACIPHEX) 20 MG tablet rabeprazole 20 mg tablet,delayed release     . testosterone (ANDROGEL) 50 MG/5GM (1%) Gel Place 5 g onto the skin 2 (two) times  daily.     . [DISCONTINUED] ALPRAZolam (Xanax XR) 1 MG 24 hr tablet Take 1 tablet (1 mg total) by mouth daily as needed (anxiety) 30 tablet 0   . [DISCONTINUED] clotrimazole-betamethasone (LOTRISONE) cream Apply to affected area 2 times daily 45 g 1   . [DISCONTINUED] hydrocortisone (Proctosol HC) 2.5 % rectal cream Place rectally 2 (two) times daily 28 g 1   . [DISCONTINUED] levothyroxine (Levoxyl) 125 MCG tablet Take 1 tablet (125 mcg total) by mouth Once a day at 6:00am 90 tablet 1          Allergies:    No Known Allergies          Past Surgical History:    Past Surgical History:   Procedure Laterality Date   . COLONOSCOPY  06/21/2017   . EGD  10/31/2018    Dr. Rosalyn Charters- erythema   . TONSILLECTOMY  05/07/1944           Family History:    Family History   Problem Relation Age of Onset   . Migraines Mother    . Stomach cancer Sister         Died age 9   . Thyroid disease Sister 75   . Colon cancer Other         Cousin           Social History:    Social History     Tobacco Use   . Smoking status: Former Smoker     Packs/day: 1.00   . Smokeless tobacco: Never Used   Substance Use Topics   . Alcohol Use     Frequency: Never   . Drug use: Not on file          The following sections were reviewed this encounter by the provider:   Tobacco  Allergies  Meds  Problems  Med Hx  Surg Hx  Fam Hx             Vital Signs:    BP 110/80 (BP Site: Left arm, Patient Position: Sitting)   Pulse 78   Temp 97.1 F (36.2 C) (Temporal)   Resp 15   Wt 73.1 kg (161 lb 4 oz)   SpO2 100%   BMI 24.52 kg/m          ROS:    Review of Systems   Constitutional: Negative for chills, diaphoresis, fatigue and fever.   HENT: Positive for congestion. Negative for ear pain, postnasal drip, rhinorrhea, sinus pressure and sore throat.    Eyes: Negative for photophobia, discharge, redness and itching.   Respiratory: Negative for cough, chest tightness, shortness of breath and wheezing.    Cardiovascular: Negative for chest pain,  palpitations and leg swelling.   Gastrointestinal: Negative for abdominal pain, blood in stool, constipation, diarrhea, nausea and vomiting.   Endocrine: Negative for cold intolerance, heat intolerance, polydipsia, polyphagia and polyuria.   Genitourinary: Negative for dysuria, flank pain, frequency, hematuria and urgency.   Musculoskeletal: Positive for arthralgias. Negative for back pain and myalgias.   Skin: Positive for rash. Negative for pallor.   Neurological: Negative for light-headedness, numbness and headaches.   Hematological: Negative for adenopathy. Does not bruise/bleed easily.   Psychiatric/Behavioral: Negative for dysphoric mood, hallucinations, self-injury, sleep disturbance and suicidal ideas. The patient is nervous/anxious.    All other systems reviewed and are negative.             Physical Exam:    Physical Exam  Vitals signs and nursing note reviewed.   Constitutional:       General: He is not in acute distress.     Appearance: Normal appearance. He is not ill-appearing, toxic-appearing or diaphoretic.   HENT:      Head: Normocephalic and atraumatic.      Right Ear: Tympanic membrane, ear canal and external ear normal.      Left Ear: Tympanic membrane, ear canal and external ear normal.      Nose: Congestion present. No rhinorrhea.      Comments: maxillary sinus tenderness present     Mouth/Throat:      Mouth: Mucous membranes are moist.      Pharynx: No oropharyngeal exudate or posterior oropharyngeal erythema.   Eyes:      General: No scleral icterus.        Right eye: No discharge.         Left eye: No discharge.      Extraocular Movements: Extraocular movements intact.      Conjunctiva/sclera: Conjunctivae normal.      Pupils: Pupils are equal, round, and reactive to light.   Neck:      Musculoskeletal: Neck supple. No muscular tenderness.   Cardiovascular:      Rate and Rhythm: Normal rate and regular rhythm.      Heart sounds: Normal heart sounds. No murmur. No gallop.    Pulmonary:       Effort: Pulmonary effort is normal. No respiratory distress.      Breath sounds: Normal breath sounds. No wheezing or rales.   Chest:      Chest wall: No tenderness.   Abdominal:      General: Abdomen is flat. There is no distension.      Palpations: Abdomen is soft. There is no mass.      Tenderness: There is no abdominal tenderness. There is  no right CVA tenderness, left CVA tenderness, guarding or rebound.      Hernia: No hernia is present.   Genitourinary:     Comments: External rectum normal  Musculoskeletal:         General: Tenderness (mild tenderness with trigger finger on right 4th digit with mild swelling but no warmth or erythema) present.      Right lower leg: No edema.      Left lower leg: No edema.   Lymphadenopathy:      Cervical: No cervical adenopathy.   Skin:     Coloration: Skin is not pale.      Findings: Rash (faint macular dry scaly rash right leg) present.      Comments: ? Cyst left scalp   Neurological:      General: No focal deficit present.      Mental Status: He is alert and oriented to person, place, and time.   Psychiatric:         Mood and Affect: Mood normal.         Behavior: Behavior normal.         Thought Content: Thought content normal.         Judgment: Judgment normal.              Assessment:    1. 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 Sebaceous cyst  - Ambulatory referral to General Surgery    3. Acute non-recurrent maxillary sinusitis  - amoxicillin-clavulanate (Augmentin) 875-125 MG per tablet; Take 1 tablet by mouth 2 (two) times daily for 10 days  Dispense: 20 tablet; Refill: 0    4. Anxiety  - ALPRAZolam (Xanax XR) 1 MG 24 hr tablet; Take 1 tablet (1 mg total) by mouth daily as needed (anxiety)  Dispense: 30 tablet; Refill: 0    5. Other  hemorrhoids  - hydrocortisone (Proctosol HC) 2.5 % rectal cream; Place rectally 2 (two) times daily  Dispense: 28 g; Refill: 1    6. Tinea pedis of right foot  - clotrimazole-betamethasone (LOTRISONE) cream; Apply to affected area 2 times daily  Dispense: 45 g; Refill: 1    7. Other specified hypothyroidism  - Levoxyl 125 MCG tablet; Take 1 tablet (125 mcg total) by mouth Once a day at 6:00am  Dispense: 90 tablet; Refill: 1            Plan:      Other hemorrhoids  A/P: Stable, continue current treatment regimen.    Acute non-recurrent maxillary sinusitis  A/P: treat with augmentin    Other specified hypothyroidism  A/P: Stable, continue current treatment regimen.    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   Pt will get 2nd hand surgeon opinion since pain persists despite surgery    Anxiety  A/P: Stable, continue current treatment regimen.  Discussed with patient that medication can be addictive and is a controlled substance, patient understands and is agreeable to be careful with use.     Patient Instructions     Epidermoid Cyst, No Infection  An epidermoid cyst is  a small abnormal growth in the top layers of the skin. It's filled with keratin, the same proteins that make up your hair and nails. An epidermoid cyst may incorrectly be called a sebaceous cyst.   Some general facts about epidermoid cysts:    An epidermoid cyst is a sac filled with material from skin secretions. It can grow anywhere on the body. But it's most often found on the face, behind the ears, and on the chest or upper back. It often has an open, enlarged pore in the middle of it.   The material in the cyst is often cheesy, fatty, or oily. The material can be thick (like cottage cheese) or liquid.   The area around the cyst may smell bad. If the cyst breaks open, the material inside it often smells bad too.   The  cyst is usually firm and you can usually move it slightly if you try.   The cyst can be smaller than a pea or as large as a few inches.   It's usually not painful, unless it becomes inflamed or infected.  Causes  Epidermoid cysts are caused when skin (epidermal) cells move under the skin surface, or are covered over by it. These cells continue to multiply, like skin does normally. They then form a wall around themselves (cyst) and secrete normal skin material (keratin). In most cases, epidermoid cysts occur for no known reason. They may also occur because of an injury to the skin or from acne     Symptoms  Symptoms of an epidermoid cyst include:   Feeling a lump just beneath the skin   It may or may not be painful   The cyst may or may not smell bad   The cyst may become inflamed or red   The cyst may leak fluid or thick material  Home care  Epidermoid cysts often go away without any treatment. If your cyst doesn't go away, and it bothers you, it may be drained or removed. If the cyst drains on its own, it may return. Resist the temptation to squeeze, pop, stick a needle in it, or cut it open. This often leads to an infection and scarring. If it gets severely inflamed or infected, seek medical care. Be sure to clean the cyst area when bathing or showering. Watch for the signs of infection listed below.   Follow-up care  Follow up with your healthcare provider, or as advised.  When to seek medical advice  Call your healthcare provider right away if any of these occur:   Swelling, redness, or pain   Pus coming from the cyst   Fever  [employer]Well last reviewed this educational content on 11/04/2017   2000-2020 [employer], [place]. [address], [place], [place] [address]. All rights reserved. This information is not intended as a substitute for professional medical care. Always follow your healthcare professional's instructions.                Follow-up:    Return if symptoms worsen or fail to improve, for  Depression or Anxiety.         Maebelle Munroe, DO

## 2019-06-05 ENCOUNTER — Encounter (INDEPENDENT_AMBULATORY_CARE_PROVIDER_SITE_OTHER): Payer: Self-pay | Admitting: Family Medicine

## 2019-06-05 ENCOUNTER — Ambulatory Visit (INDEPENDENT_AMBULATORY_CARE_PROVIDER_SITE_OTHER): Payer: Medicare Other | Admitting: Family Medicine

## 2019-06-05 VITALS — BP 110/80 | HR 78 | Temp 97.1°F | Resp 15 | Wt 161.2 lb

## 2019-06-05 DIAGNOSIS — M65341 Trigger finger, right ring finger: Secondary | ICD-10-CM | POA: Insufficient documentation

## 2019-06-05 DIAGNOSIS — L723 Sebaceous cyst: Secondary | ICD-10-CM | POA: Insufficient documentation

## 2019-06-05 DIAGNOSIS — B353 Tinea pedis: Secondary | ICD-10-CM

## 2019-06-05 DIAGNOSIS — F419 Anxiety disorder, unspecified: Secondary | ICD-10-CM

## 2019-06-05 DIAGNOSIS — E038 Other specified hypothyroidism: Secondary | ICD-10-CM

## 2019-06-05 DIAGNOSIS — J0100 Acute maxillary sinusitis, unspecified: Secondary | ICD-10-CM

## 2019-06-05 DIAGNOSIS — K648 Other hemorrhoids: Secondary | ICD-10-CM

## 2019-06-05 MED ORDER — AMOXICILLIN-POT CLAVULANATE 875-125 MG PO TABS
1.0000 | ORAL_TABLET | Freq: Two times a day (BID) | ORAL | 0 refills | Status: DC
Start: 2019-06-05 — End: 2019-06-05

## 2019-06-05 MED ORDER — LEVOXYL 125 MCG PO TABS
125.0000 ug | ORAL_TABLET | Freq: Every day | ORAL | 1 refills | Status: DC
Start: 2019-06-05 — End: 2019-11-02

## 2019-06-05 MED ORDER — CLOTRIMAZOLE-BETAMETHASONE 1-0.05 % EX CREA
TOPICAL_CREAM | CUTANEOUS | 1 refills | Status: DC
Start: 2019-06-05 — End: 2019-11-02

## 2019-06-05 MED ORDER — NAPROXEN 500 MG PO TABS
500.0000 mg | ORAL_TABLET | Freq: Two times a day (BID) | ORAL | 0 refills | Status: DC
Start: 2019-06-05 — End: 2019-11-02

## 2019-06-05 MED ORDER — AMOXICILLIN-POT CLAVULANATE 875-125 MG PO TABS
1.0000 | ORAL_TABLET | Freq: Two times a day (BID) | ORAL | 0 refills | Status: AC
Start: 2019-06-05 — End: 2019-06-15

## 2019-06-05 MED ORDER — ALPRAZOLAM ER 1 MG PO TB24
1.00 mg | ORAL_TABLET | Freq: Every day | ORAL | 0 refills | Status: DC | PRN
Start: 2019-06-05 — End: 2019-11-02

## 2019-06-05 MED ORDER — HYDROCORTISONE (PERIANAL) 2.5 % EX CREA
TOPICAL_CREAM | Freq: Two times a day (BID) | CUTANEOUS | 1 refills | Status: DC
Start: 2019-06-05 — End: 2019-11-02

## 2019-06-05 NOTE — Patient Instructions (Signed)
Epidermoid Cyst, No Infection  An epidermoid cyst is a small abnormal growth in the top layers of the skin. It's filled with keratin, the same proteins that make up your hair and nails. An epidermoid cyst may incorrectly be called a sebaceous cyst.   Some general facts about epidermoid cysts:    An epidermoid cyst is a sac filled with material from skin secretions. It can grow anywhere on the body. But it's most often found on the face, behind the ears, and on the chest or upper back. It often has an open, enlarged pore in the middle of it.   The material in the cyst is often cheesy, fatty, or oily. The material can be thick (like cottage cheese) or liquid.   The area around the cyst may smell bad. If the cyst breaks open, the material inside it often smells bad too.   The cyst is usually firm and you can usually move it slightly if you try.   The cyst can be smaller than a pea or as large as a few inches.   It's usually not painful, unless it becomes inflamed or infected.  Causes  Epidermoid cysts are caused when skin (epidermal) cells move under the skin surface, or are covered over by it. These cells continue to multiply, like skin does normally. They then form a wall around themselves (cyst) and secrete normal skin material (keratin). In most cases, epidermoid cysts occur for no known reason. They may also occur because of an injury to the skin or from acne     Symptoms  Symptoms of an epidermoid cyst include:   Feeling a lump just beneath the skin   It may or may not be painful   The cyst may or may not smell bad   The cyst may become inflamed or red   The cyst may leak fluid or thick material  Home care  Epidermoid cysts often go away without any treatment. If your cyst doesn't go away, and it bothers you, it may be drained or removed. If the cyst drains on its own, it may return. Resist the temptation to squeeze, pop, stick a needle in it, or cut it open. This often leads to an infection and  scarring. If it gets severely inflamed or infected, seek medical care. Be sure to clean the cyst area when bathing or showering. Watch for the signs of infection listed below.   Follow-up care  Follow up with your healthcare provider, or as advised.  When to seek medical advice  Call your healthcare provider right away if any of these occur:   Swelling, redness, or pain   Pus coming from the cyst   Fever  StayWell last reviewed this educational content on 11/04/2017   2000-2020 [employer] [address], [place] [address]. All rights reserved. This information is not intended as a substitute for professional medical care. Always follow your healthcare professional's instructions.

## 2019-06-05 NOTE — Assessment & Plan Note (Signed)
A/P: naprosyn as needed.   Pt will get 2nd hand surgeon opinion since pain persists despite surgery

## 2019-06-05 NOTE — Assessment & Plan Note (Signed)
A/P: Stable, continue current treatment regimen.  Discussed with patient that medication can be addictive and is a controlled substance, patient understands and is agreeable to be careful with use.

## 2019-06-05 NOTE — Assessment & Plan Note (Signed)
A/P: Stable, continue current treatment regimen.

## 2019-06-05 NOTE — Assessment & Plan Note (Signed)
A/P: improving on lotrisone

## 2019-06-05 NOTE — Assessment & Plan Note (Signed)
A/P: refer to surgeon

## 2019-06-05 NOTE — Assessment & Plan Note (Signed)
A/P: treat with augmentin

## 2019-06-09 ENCOUNTER — Other Ambulatory Visit (INDEPENDENT_AMBULATORY_CARE_PROVIDER_SITE_OTHER): Payer: Self-pay | Admitting: Family Medicine

## 2019-06-09 DIAGNOSIS — E785 Hyperlipidemia, unspecified: Secondary | ICD-10-CM

## 2019-07-16 ENCOUNTER — Encounter (INDEPENDENT_AMBULATORY_CARE_PROVIDER_SITE_OTHER): Payer: Self-pay | Admitting: Family Medicine

## 2019-07-28 ENCOUNTER — Encounter (INDEPENDENT_AMBULATORY_CARE_PROVIDER_SITE_OTHER): Payer: Self-pay | Admitting: Family Medicine

## 2019-07-28 NOTE — Progress Notes (Signed)
Reviewed

## 2019-08-27 ENCOUNTER — Encounter (INDEPENDENT_AMBULATORY_CARE_PROVIDER_SITE_OTHER): Payer: Self-pay | Admitting: Family Medicine

## 2019-09-05 ENCOUNTER — Other Ambulatory Visit (INDEPENDENT_AMBULATORY_CARE_PROVIDER_SITE_OTHER): Payer: Self-pay | Admitting: Cardiology

## 2019-09-05 DIAGNOSIS — I425 Other restrictive cardiomyopathy: Secondary | ICD-10-CM

## 2019-10-08 ENCOUNTER — Other Ambulatory Visit (INDEPENDENT_AMBULATORY_CARE_PROVIDER_SITE_OTHER): Payer: Self-pay | Admitting: Family Medicine

## 2019-10-08 DIAGNOSIS — E785 Hyperlipidemia, unspecified: Secondary | ICD-10-CM

## 2019-10-21 ENCOUNTER — Telehealth (INDEPENDENT_AMBULATORY_CARE_PROVIDER_SITE_OTHER): Payer: Self-pay

## 2019-10-21 NOTE — Telephone Encounter (Signed)
Patient called to report he purchased an android watch ~ 2 months ago and notices his heart rate increases to 130s to 150s w/ mild to moderate activity like walking. Patient reported he is asymptomatic, only sees it on the watch. Reports at rest his heart rate is 70s to 80s. BP today 122/78 hr 88. States he exercises routinely w/ no issues. Patient called to ask if he needs medication adjustments while he awaits echo and f/u w/ RAS. Advised patient best to have a sooner in-office assessment to r/o whether what he is seeing on watch is accurate.  Warm transferred to clinical schedulers to move up annual echo and for APP f/u prior to RAS f/u on August.

## 2019-10-27 ENCOUNTER — Encounter (INDEPENDENT_AMBULATORY_CARE_PROVIDER_SITE_OTHER): Payer: Self-pay

## 2019-10-27 DIAGNOSIS — E7800 Pure hypercholesterolemia, unspecified: Secondary | ICD-10-CM | POA: Insufficient documentation

## 2019-11-02 ENCOUNTER — Ambulatory Visit (INDEPENDENT_AMBULATORY_CARE_PROVIDER_SITE_OTHER): Payer: Medicare Other | Admitting: Family Medicine

## 2019-11-02 ENCOUNTER — Encounter (INDEPENDENT_AMBULATORY_CARE_PROVIDER_SITE_OTHER): Payer: Self-pay | Admitting: Family Medicine

## 2019-11-02 VITALS — BP 130/64 | HR 90 | Temp 97.1°F | Resp 16 | Ht 68.0 in | Wt 159.2 lb

## 2019-11-02 DIAGNOSIS — G8929 Other chronic pain: Secondary | ICD-10-CM

## 2019-11-02 DIAGNOSIS — E559 Vitamin D deficiency, unspecified: Secondary | ICD-10-CM

## 2019-11-02 DIAGNOSIS — M25562 Pain in left knee: Secondary | ICD-10-CM

## 2019-11-02 DIAGNOSIS — R7309 Other abnormal glucose: Secondary | ICD-10-CM

## 2019-11-02 DIAGNOSIS — E785 Hyperlipidemia, unspecified: Secondary | ICD-10-CM

## 2019-11-02 DIAGNOSIS — M5417 Radiculopathy, lumbosacral region: Secondary | ICD-10-CM

## 2019-11-02 DIAGNOSIS — R251 Tremor, unspecified: Secondary | ICD-10-CM

## 2019-11-02 DIAGNOSIS — E038 Other specified hypothyroidism: Secondary | ICD-10-CM

## 2019-11-02 DIAGNOSIS — D6489 Other specified anemias: Secondary | ICD-10-CM

## 2019-11-02 DIAGNOSIS — M542 Cervicalgia: Secondary | ICD-10-CM

## 2019-11-02 DIAGNOSIS — I2510 Atherosclerotic heart disease of native coronary artery without angina pectoris: Secondary | ICD-10-CM

## 2019-11-02 DIAGNOSIS — R110 Nausea: Secondary | ICD-10-CM

## 2019-11-02 DIAGNOSIS — Z0184 Encounter for antibody response examination: Secondary | ICD-10-CM

## 2019-11-02 DIAGNOSIS — M5090 Cervical disc disorder, unspecified, unspecified cervical region: Secondary | ICD-10-CM

## 2019-11-02 DIAGNOSIS — F419 Anxiety disorder, unspecified: Secondary | ICD-10-CM

## 2019-11-02 DIAGNOSIS — Z789 Other specified health status: Secondary | ICD-10-CM | POA: Insufficient documentation

## 2019-11-02 DIAGNOSIS — R0781 Pleurodynia: Secondary | ICD-10-CM

## 2019-11-02 DIAGNOSIS — I10 Essential (primary) hypertension: Secondary | ICD-10-CM

## 2019-11-02 MED ORDER — EZETIMIBE 10 MG PO TABS
10.0000 mg | ORAL_TABLET | Freq: Every day | ORAL | 1 refills | Status: DC
Start: 2019-11-02 — End: 2020-01-05

## 2019-11-02 MED ORDER — ATORVASTATIN CALCIUM 20 MG PO TABS
20.0000 mg | ORAL_TABLET | Freq: Every day | ORAL | 1 refills | Status: DC
Start: 2019-11-02 — End: 2020-01-05

## 2019-11-02 MED ORDER — LEVOXYL 125 MCG PO TABS
125.0000 ug | ORAL_TABLET | Freq: Every day | ORAL | 1 refills | Status: DC
Start: 2019-11-02 — End: 2020-01-27

## 2019-11-02 MED ORDER — ONDANSETRON HCL 4 MG PO TABS
4.0000 mg | ORAL_TABLET | Freq: Three times a day (TID) | ORAL | 1 refills | Status: DC | PRN
Start: 2019-11-02 — End: 2019-12-22

## 2019-11-02 MED ORDER — LIDOCAINE 5 % EX PTCH
MEDICATED_PATCH | CUTANEOUS | 3 refills | Status: DC
Start: 2019-11-02 — End: 2020-03-04

## 2019-11-02 MED ORDER — ALPRAZOLAM ER 1 MG PO TB24
1.00 mg | ORAL_TABLET | Freq: Every day | ORAL | 0 refills | Status: DC | PRN
Start: 2019-11-02 — End: 2019-12-22

## 2019-11-02 NOTE — Assessment & Plan Note (Addendum)
A/P: Stable, continue current treatment regimen.   Encourage therapeutic lifestyle changes including low fat diet, decrease salt and increase exercise, monitor blood pressure a few times a week at home, normal blood pressure is 120/80, call or follow office visit if blood pressure continues to be elevated.    I spent at least 40 minutes visit with patient with greater than 50% of that time spent counseling the patient on diagnosis, treatment options, and plan of care.  See plan under individual diagnosis.

## 2019-11-02 NOTE — Assessment & Plan Note (Signed)
A/P: Stable, continue current treatment regimen.   Patient to continue to follow up with Cardiologist specialist.  Go to ER if increase chest pain or shortness of breath.

## 2019-11-02 NOTE — Assessment & Plan Note (Signed)
A/P: check rib xray

## 2019-11-02 NOTE — Assessment & Plan Note (Signed)
A/P: Patient advised that vitamin D test may not get covered by insurance. Benefits of vitamin d testing explained.

## 2019-11-02 NOTE — Assessment & Plan Note (Signed)
A/P: better with PT, pt can continue

## 2019-11-02 NOTE — Assessment & Plan Note (Signed)
A/P:  Patient to continue to follow up with Neurologist Specialist.

## 2019-11-02 NOTE — Assessment & Plan Note (Addendum)
A/P: Stable, continue current treatment regimen.  Discussed with patient that medication can be addictive and is a controlled substance, patient understands and is agreeable to be careful with use.

## 2019-11-02 NOTE — Assessment & Plan Note (Signed)
A/P: Will recheck labs, further management pending lab results.

## 2019-11-02 NOTE — Assessment & Plan Note (Signed)
A/P:  Discussed with pt risk and benefits of Covid 19 antibody testing including limitations as to whether it may show immunity. Explained of symptoms to monitor for including fever, worsening cough, shortness of breath and abdominal symptoms including nausea and diarrhea. patient to go to ER if worsening symptoms.

## 2019-11-02 NOTE — Progress Notes (Signed)
Pt is here today  Pt stated that w/ his medications   Need medication refill   Pt takes medication as prescribed   Denies  any side effects from medication in the past.    PT would like to check on his prostate.                   [facility] [place] [facility]                       Date of Exam: 11/02/2019 6:37 PM        Patient ID: Wesley [name] is a 79 y.o. male.  Attending Physician: Maebelle Munroe, DO        Chief Complaint:    Chief Complaint   Patient presents with   . Hypertension               HPI:    Pt also following up on htn, bp runs 110s-1s0/60-70s at home with resting pulse of 80s.  Does well with watching salt in diet.  Exercising a few times a week, does 2-4 miles walking, pulse goes up to 150 when walking.  no chest pain/sob/palpitations.  On coreg 6.25mg ,  benicar 4 days a week and benicar hctz 3 days a wee by cardiology.  Has upcoming cardiology appt with Dr. Octavio Graves and echo as well.      Pt also has high cholesterol, on zetia and liptor without muscle aches.  Watches fat in diet well.  Does have hx of CAD, followed by cardiology.      Pt also follow up on anxiety, uses xanax less than once a week but does feel jumpy at times with possible tremor. Pt has not seen neurology yet.  Pt does feel claustrophobia, also seeing therapist as well.      Pt does have hypothyroid, on levoxyl .  Patient denies any fatigue, temperature intolerance, weight changes.                 Problem List:    Patient Active Problem List   Diagnosis   . LBBB (left bundle branch block)   . Anxiety   . Cervical disc disorder   . Claustrophobia   . Hemangioma of liver   . Hyperlipidemia LDL goal <70   . Other specified hypothyroidism   . Nausea   . Neck pain   . Vitamin D deficiency   . Abnormal glucose   . Cardiomyopathy   . Collagenous colitis   . Rib pain on right side   . Degenerative lumbar spinal stenosis   . Gastroesophageal reflux disease without esophagitis   . Left hip pain   . Localized  osteoarthritis of left knee   . Nonobstructive atherosclerosis of coronary artery   . Spondylosis of lumbar spine   . Lumbosacral radiculopathy   . Tremor of both hands   . Other specified anemias   . Tinea pedis of right foot   . Chronic pain of left knee   . Other hemorrhoids   . Sebaceous cyst   . Essential (primary) hypertension   . COVID-19 virus IgG antibody test result unknown             Current Meds:    Outpatient Medications Marked as Taking for the 11/02/19 encounter (Office Visit) with Maebelle Munroe, DO   Medication Sig Dispense Refill   . ALPRAZolam (Xanax XR) 1 MG 24 hr tablet Take 1  tablet (1 mg total) by mouth daily as needed (anxiety) 30 tablet 0   . amoxicillin (AMOXIL) 875 MG tablet      . atorvastatin (LIPITOR) 20 MG tablet Take 1 tablet (20 mg total) by mouth daily 90 tablet 1   . carvedilol (Coreg) 6.25 MG tablet Coreg 6.25 mg tablet   Take 1 tablet twice a day by oral route.     . Cholecalciferol (VITAMIN D) 2000 UNITS Cap Take by mouth 5000 unit       . Coenzyme Q10 (CO Q 10) 10 MG Cap Take by mouth.     . diphenoxylate-atropine (LOMOTIL) 2.5-0.025 MG per tablet Take 1 tablet by mouth 4 (four) times daily as needed for Diarrhea.     . ezetimibe (ZETIA) 10 MG tablet Take 1 tablet (10 mg total) by mouth daily 90 tablet 1   . Levoxyl 125 MCG tablet Take 1 tablet (125 mcg total) by mouth Once a day at 6:00am 90 tablet 1   . lidocaine (Lidoderm) 5 % Apply 1 patch for up to 12 hours a day, Remove & Discard patch within 12 hours 30 patch 3   . loperamide (IMODIUM) 2 MG capsule Take 2 mg by mouth 4 (four) times daily as needed for Diarrhea.     . olmesartan (BENICAR) 20 MG tablet Take 1 tablet (20 mg total) by mouth daily 4 days a week. 30 tablet 0   . olmesartan-hydrochlorothiazide (BENICAR HCT) 20-12.5 MG per tablet Take 1 tablet by mouth daily 3 days a week. 30 tablet 3   . ondansetron (ZOFRAN) 4 MG tablet Take 1 tablet (4 mg total) by mouth every 8 (eight) hours as needed for Nausea 30 tablet 1    . testosterone (ANDROGEL) 50 MG/5GM (1%) Gel Place 5 g onto the skin 2 (two) times daily.     . [DISCONTINUED] ALPRAZolam (Xanax XR) 1 MG 24 hr tablet Take 1 tablet (1 mg total) by mouth daily as needed (anxiety) 30 tablet 0   . [DISCONTINUED] atorvastatin (LIPITOR) 20 MG tablet TAKE ONE TABLET BY MOUTH DAILY 90 tablet 0   . [DISCONTINUED] ezetimibe (ZETIA) 10 MG tablet TAKE ONE TABLET BY MOUTH DAILY 90 tablet 0   . [DISCONTINUED] Levoxyl 125 MCG tablet Take 1 tablet (125 mcg total) by mouth Once a day at 6:00am 90 tablet 1   . [DISCONTINUED] lidocaine (Lidoderm) 5 % Apply 1 patch for up to 12 hours a day, Remove & Discard patch within 12 hours 30 patch 3   . [DISCONTINUED] ondansetron (ZOFRAN) 4 MG tablet every 8 (eight) hours            Allergies:    No Known Allergies          Past Surgical History:    Past Surgical History:   Procedure Laterality Date   . CARDIAC CATHETERIZATION  09/2010   . COLONOSCOPY  06/21/2017   . EGD  10/31/2018    Dr. Rosalyn Charters- erythema   . TONSILLECTOMY  05/07/1944           Family History:    Family History   Problem Relation Age of Onset   . Migraines Mother    . Stomach cancer Sister         Died age 37   . Thyroid disease Sister 44   . Colon cancer Other         Cousin   . Diabetes Father  Social History:    Social History     Tobacco Use   . Smoking status: Former Smoker     Packs/day: 1.00   . Smokeless tobacco: Never Used   Vaping Use   . Vaping Use: Never used   Substance Use Topics   . Alcohol use: Never   . Drug use: Never          The following sections were reviewed this encounter by the provider:   Tobacco  Allergies  Meds  Problems  Med Hx  Surg Hx  Fam Hx             Vital Signs:    BP 130/64 (BP Site: Right arm, Patient Position: Sitting, Cuff Size: Medium)   Pulse 90   Temp 97.1 F (36.2 C) (Temporal)   Resp 16   Ht 1.727 m (5\' 8" )   Wt 72.2 kg (159 lb 3.2 oz)   SpO2 96%   BMI 24.21 kg/m          ROS:    Review of Systems   Constitutional:  Negative for chills, diaphoresis, fatigue and fever.   Eyes: Negative for photophobia, discharge, redness and itching.   Respiratory: Negative for cough, chest tightness, shortness of breath and wheezing.    Cardiovascular: Positive for chest pain (right lower rib pain at times.  ). Negative for palpitations and leg swelling.   Gastrointestinal: Positive for nausea (at times). Negative for abdominal pain, blood in stool, constipation, diarrhea and vomiting.   Endocrine: Negative for cold intolerance, heat intolerance, polydipsia, polyphagia and polyuria.   Genitourinary: Negative for dysuria, flank pain, frequency, hematuria and urgency.   Musculoskeletal: Positive for arthralgias, back pain and neck pain. Negative for myalgias.   Skin: Positive for rash (right foot). Negative for pallor.   Neurological: Positive for tremors (stable). Negative for weakness, light-headedness, numbness and headaches.   Hematological: Negative for adenopathy. Does not bruise/bleed easily.   Psychiatric/Behavioral: Negative for dysphoric mood, hallucinations, self-injury, sleep disturbance and suicidal ideas. The patient is nervous/anxious. The patient is not hyperactive.    All other systems reviewed and are negative.             Physical Exam:    Physical Exam  Vitals and nursing note reviewed.   Constitutional:       General: He is not in acute distress.     Appearance: Normal appearance. He is not ill-appearing, toxic-appearing or diaphoretic.   HENT:      Head: Normocephalic and atraumatic.   Eyes:      General: No scleral icterus.        Right eye: No discharge.         Left eye: No discharge.      Extraocular Movements: Extraocular movements intact.      Conjunctiva/sclera: Conjunctivae normal.      Pupils: Pupils are equal, round, and reactive to light.   Cardiovascular:      Rate and Rhythm: Normal rate and regular rhythm.      Heart sounds: Normal heart sounds. No murmur heard.   No gallop.    Pulmonary:      Effort: Pulmonary  effort is normal. No respiratory distress.      Breath sounds: Normal breath sounds. No wheezing or rales.   Chest:      Chest wall: No tenderness.   Abdominal:      General: Abdomen is flat. There is no distension.      Palpations: Abdomen  is soft. There is no mass.      Tenderness: There is no abdominal tenderness. There is no right CVA tenderness, left CVA tenderness, guarding or rebound.      Hernia: No hernia is present.   Genitourinary:     Prostate: Normal.      Rectum: Normal.   Musculoskeletal:         General: No tenderness (over neck or low back).      Cervical back: Neck supple. No muscular tenderness.      Right lower leg: No edema.      Left lower leg: No edema.   Lymphadenopathy:      Cervical: No cervical adenopathy.   Skin:     Coloration: Skin is not pale.      Findings: No rash.   Neurological:      General: No focal deficit present.      Mental Status: He is alert and oriented to person, place, and time.      Motor: No weakness.      Comments: Bilateral resting hand tremor   Psychiatric:         Mood and Affect: Mood normal.         Behavior: Behavior normal.         Thought Content: Thought content normal.         Judgment: Judgment normal.              Assessment:    1. Essential (primary) hypertension    2. Hyperlipidemia LDL goal <70  - ezetimibe (ZETIA) 10 MG tablet; Take 1 tablet (10 mg total) by mouth daily  Dispense: 90 tablet; Refill: 1  - atorvastatin (LIPITOR) 20 MG tablet; Take 1 tablet (20 mg total) by mouth daily  Dispense: 90 tablet; Refill: 1  - Comprehensive metabolic panel  - Lipid panel  - Creatine Kinase (CK)    3. Anxiety  - ALPRAZolam (Xanax XR) 1 MG 24 hr tablet; Take 1 tablet (1 mg total) by mouth daily as needed (anxiety)  Dispense: 30 tablet; Refill: 0    4. Nonobstructive atherosclerosis of coronary artery    5. Other specified hypothyroidism  - Levoxyl 125 MCG tablet; Take 1 tablet (125 mcg total) by mouth Once a day at 6:00am  Dispense: 90 tablet; Refill: 1  - TSH  -  T4, free  - T3, free    6. Chronic pain of left knee  - lidocaine (Lidoderm) 5 %; Apply 1 patch for up to 12 hours a day, Remove & Discard patch within 12 hours  Dispense: 30 patch; Refill: 3    7. Nausea  - ondansetron (ZOFRAN) 4 MG tablet; Take 1 tablet (4 mg total) by mouth every 8 (eight) hours as needed for Nausea  Dispense: 30 tablet; Refill: 1    8. Lumbosacral radiculopathy  - Referral to Physical Therapy - EXTERNAL    9. Neck pain  - Referral to Physical Therapy - EXTERNAL    10. Cervical disc disorder    11. Rib pain on right side  - XR Ribs Right W PA Chest; Future  - XR Ribs Right W PA Chest    12. Vitamin D deficiency  - Vitamin D,25 OH, Total    13. Tremor of both hands    14. Other specified anemias  - CBC and differential    15. Abnormal glucose  - Hemoglobin A1C    16. COVID-19 virus IgG antibody test result unknown  - SARS-CoV-2  Antibody, IgG            Plan:      Nonobstructive atherosclerosis of coronary artery  A/P: Stable, continue current treatment regimen.   Patient to continue to follow up with Cardiologist specialist.  Go to ER if increase chest pain or shortness of breath.    Hyperlipidemia LDL goal <70  A/P: pt will go to [facility] hospital lab.  Will recheck labs, further management pending lab results.    Essential (primary) hypertension  A/P: Stable, continue current treatment regimen.   Encourage therapeutic lifestyle changes including low fat diet, decrease salt and increase exercise, monitor blood pressure a few times a week at home, normal blood pressure is 120/80, call or follow office visit if blood pressure continues to be elevated.    I spent at least 40 minutes visit with patient with greater than 50% of that time spent counseling the patient on diagnosis, treatment options, and plan of care.  See plan under individual diagnosis.      Other specified hypothyroidism  A/P: Will recheck labs, further management pending lab results.    Lumbosacral radiculopathy  A/P: better with PT,  pt can continue    Cervical disc disorder  A/P: continue with PT    Vitamin D deficiency  A/P: Patient advised that vitamin D test may not get covered by insurance. Benefits of vitamin d testing explained.    Tremor of both hands  A/P:  Patient to continue to follow up with Neurologist Specialist.    Rib pain on right side  A/P: check rib xray    Other specified anemias  A/P: Will recheck labs, further management pending lab results.    Neck pain  A/P: start PT    Nausea  A/P: Stable, continue current treatment regimen.    COVID-19 virus IgG antibody test result unknown  A/P:  Discussed with pt risk and benefits of Covid 19 antibody testing including limitations as to whether it may show immunity. Explained of symptoms to monitor for including fever, worsening cough, shortness of breath and abdominal symptoms including nausea and diarrhea. patient to go to ER if worsening symptoms.    Chronic pain of left knee  A/P: Stable, continue current treatment regimen.    Anxiety  A/P: Stable, continue current treatment regimen.  Discussed with patient that medication can be addictive and is a controlled substance, patient understands and is agreeable to be careful with use.    Abnormal glucose  A/P: Will recheck labs, further management pending lab results.     Patient Instructions       Understanding Coronary Artery Disease (CAD)    To understand coronary artery disease (CAD), you need to know how your heart works. Your heart is a muscle that pumps blood throughout your body. To work right, your heart needs a steady supply of oxygen. It gets this oxygen from blood supplied by the coronary arteries.     Healthy artery   Healthy artery. When a coronary artery is healthy and has no blockages, blood flows through easily. Healthy arteries can easily supply the oxygen-rich blood your heart needs.     Damaged artery   Damaged artery. Coronary artery disease starts when damage to the artery lining leads to the buildup of fat-like  substances and cholesterol along the artery wall. This is called plaque. This damage could be caused by conditions like high blood pressure, high blood sugar, or smoking. This plaque buildup starts to narrow the arteries carrying blood  to the heart. This is called atherosclerosis,     Narrowed artery   Narrowed artery. As more plaque builds up, your artery has trouble supplying blood to your heart muscle when it needs it most, such as during exercise. You may not feel any symptoms when this happens. Or you may feel angina--pressure, tightness, achiness, or pain in your chest, jaw, neck, back, or arm.     Blocked artery   Blocked artery. A piece of plaque may break off and completely block the artery. Or more commonly, a blood clot may form on a ruptured plaque andplug the narrowed artery. When this happens, blood flow is blocked from reaching the heart. Without oxygen-rich blood, part of the heart muscle becomes damaged and stops working. You may feel crushing pressure or pain in or around your chest. This is a heart attack (acute myocardial infarction, or AMI) and is a medical emergency.  [employer] last reviewed this educational content on 08/05/2017   2000-2021 [employer], [place]. All rights reserved. This information is not intended as a substitute for professional medical care. Always follow your healthcare professional's instructions.                Follow-up:    Return in about 6 months (around 05/03/2020), or if symptoms worsen or fail to improve, for High Cholesterol.         Maebelle Munroe, DO

## 2019-11-02 NOTE — Assessment & Plan Note (Signed)
A/P: pt will go to [facility] hospital lab.  Will recheck labs, further management pending lab results.

## 2019-11-02 NOTE — Assessment & Plan Note (Signed)
A/P: continue with PT

## 2019-11-02 NOTE — Patient Instructions (Signed)
Understanding Coronary Artery Disease (CAD)    To understand coronary artery disease (CAD), you need to know how your heart works. Your heart is a muscle that pumps blood throughout your body. To work right, your heart needs a steady supply of oxygen. It gets this oxygen from blood supplied by the coronary arteries.     Healthy artery     Healthy artery. When a coronary artery is healthy and has no blockages, blood flows through easily. Healthy arteries can easily supply the oxygen-rich blood your heart needs.     Damaged artery     Damaged artery. Coronary artery disease starts when damage to the artery lining leads to the buildup of fat-like substances and cholesterol along the artery wall. This is called plaque. This damage could be caused by conditions like high blood pressure, high blood sugar, or smoking. This plaque buildup starts to narrow the arteries carrying blood to the heart. This is called atherosclerosis,     Narrowed artery     Narrowed artery. As more plaque builds up, your artery has trouble supplying blood to your heart muscle when it needs it most, such as during exercise. You may not feel any symptoms when this happens. Or you may feel angina--pressure, tightness, achiness, or pain in your chest, jaw, neck, back, or arm.     Blocked artery     Blocked artery. A piece of plaque may break off and completely block the artery. Or more commonly, a blood clot may form on a ruptured plaque and plug the narrowed artery. When this happens, blood flow is blocked from reaching the heart. Without oxygen-rich blood, part of the heart muscle becomes damaged and stops working. You may feel crushing pressure or pain in or around your chest. This is a heart attack (acute myocardial infarction, or AMI) and is a medical emergency.  [employer] last reviewed this educational content on 08/05/2017    © 2000-2021 [employer]. All rights reserved. This information is not intended as a substitute for  professional medical care. Always follow your healthcare professional's instructions.

## 2019-11-02 NOTE — Assessment & Plan Note (Signed)
A/P: Stable, continue current treatment regimen.

## 2019-11-02 NOTE — Assessment & Plan Note (Signed)
A/P: start PT

## 2019-11-04 ENCOUNTER — Ambulatory Visit (INDEPENDENT_AMBULATORY_CARE_PROVIDER_SITE_OTHER): Payer: Medicare Other | Admitting: Family Medicine

## 2019-11-05 ENCOUNTER — Ambulatory Visit (INDEPENDENT_AMBULATORY_CARE_PROVIDER_SITE_OTHER): Payer: Medicare Other | Admitting: Radiology

## 2019-11-05 DIAGNOSIS — I425 Other restrictive cardiomyopathy: Secondary | ICD-10-CM

## 2019-11-06 ENCOUNTER — Ambulatory Visit (INDEPENDENT_AMBULATORY_CARE_PROVIDER_SITE_OTHER): Payer: Medicare Other | Admitting: Family Medicine

## 2019-11-10 ENCOUNTER — Telehealth (INDEPENDENT_AMBULATORY_CARE_PROVIDER_SITE_OTHER): Payer: Self-pay

## 2019-11-10 NOTE — Telephone Encounter (Addendum)
Called patient and relayed echo results per Dr. Octavio Graves. Confirmed appt with [facility] tomorrow. Patient verbalized understanding.    ----- Message from Lamount Cranker, MD sent at 11/07/2019  4:51 PM EDT -----  Regarding: plse notify pt of echo results  I have got great news your echo is stable.  No new findings.  Follow-up and plans as outlined previously.  Do not hesitate to call if there are additional questions.         Conclusions:   1. Visually estimated ejection fraction is ~45%, 47% by Biplane. Septal wall motion abnormalities are seen c/w LBBB. There is Grade I (abnormal LV relaxation pattern) diastolic dysfunction.    2. The aortic root is slightly increased in size.    3. Overall fairly similar to the echo May 2020.

## 2019-11-11 ENCOUNTER — Encounter (INDEPENDENT_AMBULATORY_CARE_PROVIDER_SITE_OTHER): Payer: Self-pay | Admitting: Nurse Practitioner

## 2019-11-11 ENCOUNTER — Ambulatory Visit (INDEPENDENT_AMBULATORY_CARE_PROVIDER_SITE_OTHER): Payer: Medicare Other | Admitting: Nurse Practitioner

## 2019-11-11 VITALS — BP 148/86 | HR 84 | Ht 68.0 in | Wt 155.0 lb

## 2019-11-11 DIAGNOSIS — I10 Essential (primary) hypertension: Secondary | ICD-10-CM

## 2019-11-11 DIAGNOSIS — I429 Cardiomyopathy, unspecified: Secondary | ICD-10-CM

## 2019-11-11 DIAGNOSIS — I447 Left bundle-branch block, unspecified: Secondary | ICD-10-CM

## 2019-11-11 MED ORDER — CARVEDILOL 6.25 MG PO TABS
6.2500 mg | ORAL_TABLET | Freq: Two times a day (BID) | ORAL | 3 refills | Status: DC
Start: 2019-11-11 — End: 2020-01-29

## 2019-11-11 NOTE — Progress Notes (Signed)
[facility] OFF[facility] PROGRESS NOTE    HRT [facility] OFFICE -CARDIOLOGY  [address]  [place] [address]  Dept: [phone]  Dept Fax: [phone]       Patient Name: Wesley [name], Wesley [name]    Date of Visit:  November 11, 2019  Date of Birth: Dec 08, 1940  AGE: 79 y.o.  Medical Record #: [id number]  Requesting Physician: Maebelle Munroe, DO      CHIEF COMPLAINT: Tachycardia      HISTORY OF PRESENT ILLNESS:    He is a pleasant 79 y.o. male who presents today for reports of elevated HR noted on his new watch 130-150's on when he exercises or goes up the stairs several times.  He says he doesn't have any symptoms.  He was seen several months ago and reported elevated HR at times with exercise 120-130's completely asymptomatic.  He does have a known LBBB and well compensated CM with EF 40-45%.  His HR decreases quickly without exercise and he denies symptoms.       PAST MEDICAL HISTORY: He has a past medical history of Cardiomyopathy, Collagenous colitis, echocardiogram (01/2010, 03/2015, 05/2017, 09/2018), Gastroesophageal reflux disease, heart scan, Hyperlipidemia, Hypertension, Hypothyroidism, LBBB (left bundle branch block), MPI ST (2001, 2011), Mumps, Seasonal allergic rhinitis, and Vitamin D deficiency. He has a past surgical history that includes EGD (10/31/2018); Colonoscopy (06/21/2017); TONSILLECTOMY (05/07/1944); and Cardiac catheterization (09/2010).    ALLERGIES: No Known Allergies    MEDICATIONS:   Current Outpatient Medications   Medication Sig   . ALPRAZolam (Xanax XR) 1 MG 24 hr tablet Take 1 tablet (1 mg total) by mouth daily as needed (anxiety)   . atorvastatin (LIPITOR) 20 MG tablet Take 1 tablet (20 mg total) by mouth daily   . carvedilol (Coreg) 6.25 MG tablet Take 1 tablet (6.25 mg total) by mouth 2 (two) times daily   . Cholecalciferol (VITAMIN D) 2000 UNITS Cap Take by mouth 5000 unit     . Coenzyme Q10 (CO Q 10) 10 MG Cap Take by mouth.   .  ezetimibe (ZETIA) 10 MG tablet Take 1 tablet (10 mg total) by mouth daily   . Levoxyl 125 MCG tablet Take 1 tablet (125 mcg total) by mouth Once a day at 6:00am   . lidocaine (Lidoderm) 5 % Apply 1 patch for up to 12 hours a day, Remove & Discard patch within 12 hours   . loperamide (IMODIUM) 2 MG capsule Take 2 mg by mouth 4 (four) times daily as needed for Diarrhea.   . olmesartan (BENICAR) 20 MG tablet Take 1 tablet (20 mg total) by mouth daily 4 days a week.   . olmesartan-hydrochlorothiazide (BENICAR HCT) 20-12.5 MG per tablet Take 1 tablet by mouth daily 3 days a week.   . ondansetron (ZOFRAN) 4 MG tablet Take 1 tablet (4 mg total) by mouth every 8 (eight) hours as needed for Nausea   . testosterone (ANDROGEL) 50 MG/5GM (1%) Gel Place 5 g onto the skin 2 (two) times daily.   Marland Kitchen amoxicillin (AMOXIL) 875 MG tablet         FAMILY HISTORY: family history includes Colon cancer in an other family member; Diabetes in his father; Migraines in his mother; Stomach cancer in his sister; Thyroid disease (age of onset: 78) in his sister.    SOCIAL HISTORY: He reports that he has quit smoking. He smoked 1.00 pack per day. He has never used smokeless tobacco. He  reports that he does not drink alcohol and does not use drugs.    PHYSICAL EXAMINATION    Visit Vitals  BP 148/86 (BP Site: Right arm, Patient Position: Sitting, Cuff Size: Medium)   Pulse 84   Ht 1.727 m (5\' 8" )   Wt 70.3 kg (155 lb)   BMI 23.57 kg/m       General Appearance:  A well-appearing, in no acute distress.    Skin: Warm and dry to touch  Head: Normocephalic, normal hair pattern  Eyes:  conjunctivae and lids unremarkable.  ENT: mask   Neck: JVP normal, no carotid bruit, thyroid not enlarged   Chest: Clear to auscultation bilaterally with good air movement and respiratory effort and no wheezes, rales, or rhonchi   Cardiovascular: Regular rhythm, S1 normal, S2 normal, No S3 or S4, Apical impulse not displaced. No murmurs. No gallops or rubs detected    Abdomen: Soft, nontender, nondistended, with normoactive bowel sounds  Extremities: Warm without edema. No clubbing, or cyanosis. All peripheral pulses are full and equal.   Neuro: Alert and oriented x3. No gross motor or sensory deficits noted, affect appropriate.        ECG: NA      LABS:   No results found for: WBC, HGB, HCT, PLT  No results found for: GLU, BUN, CREAT, NA, K, CL, CO2, AST, ALT  No results found for: MG, TSH, HGBA1C, BNP  No results found for: CHOL, TRIG, HDL, LDL         Most recent echo and nuclear study reviewed.      IMPRESSION:   Wesley [name] is a 79 y.o. male with the following problems:      IMPRESSIONS:  1.         Noted elevated HR without symptoms with exercise and exertion with quick recovery.  2.         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  ACC Stage B; NYHA Class I. Euvolemic and stable from volume standpoint.                           a.         Echocardiogram in September of 2011, borderline LV systolic function,approximately 55%.  Echo Nov 2016, EF 55-60%. Echo 06/04/2017,EF down to 35-40%; to 50% June 2019.                          b.         Echo May 2020 LVEF 40-45%  2.         Nonobstructive coronary atherosclerosis.                          a.         History of a CAC score elevated.                          b.         Reassuring cardiac catheterization on Sep 12, 2010, EF of 40-45% with near normal coronary arteries.  3.         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                           a.  Possible diastolic dysfunction by prior echo.   4.         History of false positive MPI in 2011 by stress Bruce protocol MPI.  With this demonstrated moderate-sized apical septal/septal perfusion defect; however, the patient has underlying left bundle branch block and this was performed as an exercise study.  5.         Mild carotid atherosclerosis by duplex study in September of 2011.  No significant stenosis.  6.         Other  medical issues:                          a.         Recent situational work stresses, improving.                          b.         Chronic lower back pain for which he receives periodic injections.  He gets hiccups and takes Zofran prophylactically.     RECOMMENDATIONS:  1. Continue current medications  2.         Provided reassurance.  In this setting, he is asymptomatic and this is a normal exercise response.  It is likely higher for his age due to his CM.  3.         ROV with Dr. Octavio Graves in August is scheduled.                                                   No orders of the defined types were placed in this encounter.      Orders Placed This Encounter   Medications   . carvedilol (Coreg) 6.25 MG tablet     Sig: Take 1 tablet (6.25 mg total) by mouth 2 (two) times daily     Dispense:  180 tablet     Refill:  3       SIGNED:    Ernestina Penna, NP          This note was generated by the Dragon speech recognition and may contain errors or omissions not intended by the user. Grammatical errors, random word insertions, deletions, pronoun errors, and incomplete sentences are occasional consequences of this technology due to software limitations. Not all errors are caught or corrected. If there are questions or concerns about the content of this note or information contained within the body of this dictation, they should be addressed directly with the author for clarification.

## 2019-11-12 NOTE — Progress Notes (Signed)
Reviewed

## 2019-11-13 ENCOUNTER — Other Ambulatory Visit (INDEPENDENT_AMBULATORY_CARE_PROVIDER_SITE_OTHER): Payer: Self-pay | Admitting: Radiology

## 2019-11-14 ENCOUNTER — Telehealth (INDEPENDENT_AMBULATORY_CARE_PROVIDER_SITE_OTHER): Payer: Self-pay | Admitting: Family Medicine

## 2019-11-14 ENCOUNTER — Encounter (INDEPENDENT_AMBULATORY_CARE_PROVIDER_SITE_OTHER): Payer: Self-pay | Admitting: Family Medicine

## 2019-11-14 NOTE — Telephone Encounter (Signed)
[employer] recert received from [employer] via fax. Scanned & placed in Dr. Weyman Rodney folder for signature. Please address, ty

## 2019-11-16 ENCOUNTER — Encounter (INDEPENDENT_AMBULATORY_CARE_PROVIDER_SITE_OTHER): Payer: Self-pay | Admitting: Family Medicine

## 2019-11-16 ENCOUNTER — Telehealth (INDEPENDENT_AMBULATORY_CARE_PROVIDER_SITE_OTHER): Payer: Self-pay | Admitting: Family Medicine

## 2019-11-16 NOTE — Telephone Encounter (Signed)
Dr.Nguyen signed, place form in [place] folder.

## 2019-11-16 NOTE — Telephone Encounter (Signed)
S/w pt v/u

## 2019-11-16 NOTE — Telephone Encounter (Signed)
11/13/19    Sodium, potassium, electrolytes, calcium,  kidney , liver normal.    Your creatine kinase muscle enzyme is in normal range.    Your cholesterol is in excellent range with LDL bad cholesterol and triglycerides values at goal and normal HDL good cholesterol.    Your Glucose is elevated but Hemoglobin A1C test which is an average of how patient blood sugar is over 3 months is in normal range, no prediabetes or diabetes present.    t3 slightly low, will monitor.

## 2019-11-16 NOTE — Telephone Encounter (Signed)
Can you check on this bupha

## 2019-11-20 ENCOUNTER — Ambulatory Visit (INDEPENDENT_AMBULATORY_CARE_PROVIDER_SITE_OTHER): Payer: Medicare Other

## 2019-11-20 ENCOUNTER — Encounter (INDEPENDENT_AMBULATORY_CARE_PROVIDER_SITE_OTHER): Payer: Self-pay

## 2019-11-20 DIAGNOSIS — I10 Essential (primary) hypertension: Secondary | ICD-10-CM

## 2019-11-20 DIAGNOSIS — E559 Vitamin D deficiency, unspecified: Secondary | ICD-10-CM

## 2019-11-20 DIAGNOSIS — E785 Hyperlipidemia, unspecified: Secondary | ICD-10-CM

## 2019-11-21 ENCOUNTER — Encounter (INDEPENDENT_AMBULATORY_CARE_PROVIDER_SITE_OTHER): Payer: Self-pay | Admitting: Family Medicine

## 2019-11-21 LAB — SARS COV 2 ANTIBODY IGG: DiaSorin SARS-CoV-2 Ab, IgG: POSITIVE

## 2019-11-22 NOTE — Progress Notes (Signed)
Covid antibody testing shows patient does have either past exposure to covid or has developed antibodies with immunity due to covid vaccine.

## 2019-11-23 NOTE — Progress Notes (Signed)
The patient presents today for labs to be drawn by [facility].

## 2019-11-25 ENCOUNTER — Telehealth (INDEPENDENT_AMBULATORY_CARE_PROVIDER_SITE_OTHER): Payer: Self-pay | Admitting: Family Medicine

## 2019-11-25 NOTE — Telephone Encounter (Signed)
11/20/19 Your chest xray and rib xray is normal, no pneumonia, no masses or lesions, no signs of heart failure.  No fractures.  There are aortic calcifications, pt does have known CAD.  Continue current meds

## 2019-11-26 NOTE — Telephone Encounter (Signed)
S/w pt v/u, pt would like to have Vitamin D result and CXR send to his mychart,ty

## 2019-11-27 ENCOUNTER — Encounter (INDEPENDENT_AMBULATORY_CARE_PROVIDER_SITE_OTHER): Payer: Self-pay

## 2019-11-27 NOTE — Telephone Encounter (Signed)
Since it was scanned in, I don't know if we can send it to mychart.  We can mail pt results?

## 2019-12-01 ENCOUNTER — Other Ambulatory Visit (INDEPENDENT_AMBULATORY_CARE_PROVIDER_SITE_OTHER): Payer: Self-pay

## 2019-12-01 NOTE — Telephone Encounter (Signed)
Sent to pt's address.

## 2019-12-04 ENCOUNTER — Other Ambulatory Visit (INDEPENDENT_AMBULATORY_CARE_PROVIDER_SITE_OTHER): Payer: Self-pay | Admitting: Family Medicine

## 2019-12-22 ENCOUNTER — Ambulatory Visit (INDEPENDENT_AMBULATORY_CARE_PROVIDER_SITE_OTHER): Payer: Medicare Other | Admitting: Family Medicine

## 2019-12-22 ENCOUNTER — Encounter (INDEPENDENT_AMBULATORY_CARE_PROVIDER_SITE_OTHER): Payer: Self-pay | Admitting: Family Medicine

## 2019-12-22 VITALS — BP 130/62 | HR 96 | Temp 97.0°F | Resp 16 | Ht 68.0 in | Wt 160.8 lb

## 2019-12-22 DIAGNOSIS — R0982 Postnasal drip: Secondary | ICD-10-CM | POA: Insufficient documentation

## 2019-12-22 DIAGNOSIS — R531 Weakness: Secondary | ICD-10-CM | POA: Insufficient documentation

## 2019-12-22 DIAGNOSIS — F419 Anxiety disorder, unspecified: Secondary | ICD-10-CM

## 2019-12-22 DIAGNOSIS — K219 Gastro-esophageal reflux disease without esophagitis: Secondary | ICD-10-CM

## 2019-12-22 DIAGNOSIS — R110 Nausea: Secondary | ICD-10-CM

## 2019-12-22 DIAGNOSIS — I10 Essential (primary) hypertension: Secondary | ICD-10-CM

## 2019-12-22 MED ORDER — ONDANSETRON HCL 4 MG PO TABS
4.0000 mg | ORAL_TABLET | Freq: Three times a day (TID) | ORAL | 1 refills | Status: DC | PRN
Start: 2019-12-22 — End: 2020-04-18

## 2019-12-22 MED ORDER — CETIRIZINE HCL 10 MG PO TABS
10.0000 mg | ORAL_TABLET | Freq: Every day | ORAL | 11 refills | Status: AC
Start: 2019-12-22 — End: ?

## 2019-12-22 MED ORDER — ALPRAZOLAM ER 0.5 MG PO TB24
0.5000 mg | ORAL_TABLET | Freq: Every day | ORAL | 1 refills | Status: DC | PRN
Start: 2019-12-22 — End: 2020-04-18

## 2019-12-22 NOTE — Assessment & Plan Note (Signed)
A/P: Stable, continue current treatment regimen.  Discussed with patient that medication can be addictive and is a controlled substance, patient understands and is agreeable to be careful with use.

## 2019-12-22 NOTE — Assessment & Plan Note (Addendum)
A/P: refer to allergist.  Add zyrtec    Discussed with pt risk and benefits of Covid 19 testing. Explained of symptoms to monitor for including fever, worsening cough, shortness of breath and abdominal symptoms including nausea and diarrhea. patient to go to ER if worsening symptoms.  Marland Kitchen

## 2019-12-22 NOTE — Assessment & Plan Note (Signed)
A/P: after eating at times.  Recent labs showed normal a1c.  Pt will f/u with allergist and GI

## 2019-12-22 NOTE — Progress Notes (Signed)
Pt is here today   Pt stated that he would like to discuss about the medication, pt stated that he has mucus build up at early in the morning not sure if that from allergy. Pt also would like to discuss about his energy after his eat 30 min he seem like have not energy.  Pt might need allergies specialist.                    [facility] [place] [facility]                       Date of Exam: 12/22/2019 2:49 PM        Patient ID: Wesley [name] is a 79 y.o. male.  Attending Physician: Maebelle Munroe, DO        Chief Complaint:    Chief Complaint   Patient presents with   . Anxiety               HPI:    Here for anxiety follow up, uses xanax about 2 times a week, only in certain stressful situations.  No depression.    Pt has been having post nasal drip since 05/2019.  Treat with augmentin for sinus infection and recent amox by dentist as well but not much relief.  When pt is in [place], does not have symptoms.  Pt believes it may be allergies.  Symptoms appear to be always in morning time.      After pt eats meals, at times for 30 minutes feel weak.  No abdominal pain.  No specific foods.  Does get nausea in morning.  Uses zofran which helps.  No vomiting or diarrhea.  Pt does have gerd, seen by Dr. Allena Katz, GI, has not needed any gerd meds.  Did have egd last year.                Problem List:    Patient Active Problem List   Diagnosis   . Left bundle branch block (LBBB)   . Anxiety   . Cervical disc disorder   . Claustrophobia   . Hemangioma of liver   . Hyperlipidemia LDL goal <70   . Other specified hypothyroidism   . Nausea   . Neck pain on left side   . Vitamin D deficiency   . Abnormal glucose   . Cardiomyopathy   . Collagenous colitis   . Rib pain on right side   . Degenerative lumbar spinal stenosis   . Gastroesophageal reflux disease without esophagitis   . Left hip pain   . Localized osteoarthritis of left knee   . Nonobstructive atherosclerosis of coronary artery   . Spondylosis of lumbar spine    . Lumbosacral radiculopathy   . Tremor of both hands   . Other specified anemias   . Tinea pedis of right foot   . Chronic pain of left knee   . Other hemorrhoids   . Sebaceous cyst   . Essential (primary) hypertension   . COVID-19 virus IgG antibody test result unknown   . Post-nasal drip   . Weakness             Current Meds:    Outpatient Medications Marked as Taking for the 12/22/19 encounter (Office Visit) with Maebelle Munroe, DO   Medication Sig Dispense Refill   . ALPRAZolam (Xanax XR) 0.5 MG 24 hr tablet Take 1 tablet (0.5 mg total) by mouth daily  as needed (anxiety) 30 tablet 1   . atorvastatin (LIPITOR) 20 MG tablet Take 1 tablet (20 mg total) by mouth daily 90 tablet 1   . carvedilol (Coreg) 6.25 MG tablet Take 1 tablet (6.25 mg total) by mouth 2 (two) times daily 180 tablet 3   . Cholecalciferol (VITAMIN D) 2000 UNITS Cap Take by mouth 5000 unit       . Coenzyme Q10 (CO Q 10) 10 MG Cap Take by mouth.     . ezetimibe (ZETIA) 10 MG tablet Take 1 tablet (10 mg total) by mouth daily 90 tablet 1   . hydrocortisone 2.5 % cream PLACE RECTALLY TWICE A DAY 28 g 0   . Levoxyl 125 MCG tablet Take 1 tablet (125 mcg total) by mouth Once a day at 6:00am 90 tablet 1   . lidocaine (Lidoderm) 5 % Apply 1 patch for up to 12 hours a day, Remove & Discard patch within 12 hours 30 patch 3   . loperamide (IMODIUM) 2 MG capsule Take 2 mg by mouth 4 (four) times daily as needed for Diarrhea.     . olmesartan (BENICAR) 20 MG tablet Take 1 tablet (20 mg total) by mouth daily 4 days a week. 30 tablet 0   . olmesartan-hydrochlorothiazide (BENICAR HCT) 20-12.5 MG per tablet Take 1 tablet by mouth daily 3 days a week. 30 tablet 3   . ondansetron (ZOFRAN) 4 MG tablet Take 1 tablet (4 mg total) by mouth every 8 (eight) hours as needed for Nausea 30 tablet 1   . testosterone (ANDROGEL) 50 MG/5GM (1%) Gel Place 5 g onto the skin 2 (two) times daily 1 pack, 4 day /week       . [DISCONTINUED] ALPRAZolam (Xanax XR) 1 MG 24 hr tablet Take 1  tablet (1 mg total) by mouth daily as needed (anxiety) 30 tablet 0   . [DISCONTINUED] ondansetron (ZOFRAN) 4 MG tablet Take 1 tablet (4 mg total) by mouth every 8 (eight) hours as needed for Nausea 30 tablet 1          Allergies:    No Known Allergies          Past Surgical History:    Past Surgical History:   Procedure Laterality Date   . CARDIAC CATHETERIZATION  09/2010   . COLONOSCOPY  06/21/2017   . EGD  10/31/2018    Dr. Rosalyn Charters- erythema   . TONSILLECTOMY  05/07/1944           Family History:    Family History   Problem Relation Age of Onset   . Migraines Mother    . Stomach cancer Sister         Died age 52   . Thyroid disease Sister 60   . Colon cancer Other         Cousin   . Diabetes Father            Social History:    Social History     Tobacco Use   . Smoking status: Former Smoker     Packs/day: 1.00   . Smokeless tobacco: Never Used   . Tobacco comment: quit 40 years ago    Vaping Use   . Vaping Use: Never used   Substance Use Topics   . Alcohol use: Never   . Drug use: Never          The following sections were reviewed this encounter by the provider:   Tobacco  Allergies  Meds  Problems  Med Hx  Surg Hx  Fam Hx             Vital Signs:    BP 130/62 (BP Site: Right arm, Patient Position: Sitting, Cuff Size: Medium)   Pulse 96   Temp 97 F (36.1 C) (Temporal)   Resp 16   Ht 1.727 m (5\' 8" )   Wt 72.9 kg (160 lb 12.8 oz)   SpO2 96%   BMI 24.45 kg/m          ROS:    Review of Systems   Constitutional: Negative for chills, diaphoresis, fatigue and fever.   HENT: Positive for postnasal drip. Negative for congestion, ear pain, rhinorrhea, sinus pressure and sore throat.    Eyes: Negative for photophobia, discharge, redness and itching.   Respiratory: Negative for cough, chest tightness, shortness of breath and wheezing.    Cardiovascular: Negative for chest pain (right lower rib pain at times.  ), palpitations and leg swelling.   Gastrointestinal: Positive for nausea (at times). Negative  for abdominal pain, blood in stool, constipation, diarrhea and vomiting.   Endocrine: Negative for cold intolerance, heat intolerance, polydipsia, polyphagia and polyuria.   Genitourinary: Negative for dysuria, flank pain, frequency, hematuria and urgency.   Musculoskeletal: Negative for myalgias.   Skin: Positive for rash (right foot). Negative for pallor.   Neurological: Positive for weakness (after eating). Negative for light-headedness, numbness and headaches. Tremors: stable.   Hematological: Negative for adenopathy. Does not bruise/bleed easily.   Psychiatric/Behavioral: Negative for dysphoric mood, hallucinations, self-injury, sleep disturbance and suicidal ideas. The patient is nervous/anxious. The patient is not hyperactive.    All other systems reviewed and are negative.             Physical Exam:    Physical Exam  Vitals and nursing note reviewed.   Constitutional:       General: He is not in acute distress.     Appearance: Normal appearance. He is not ill-appearing, toxic-appearing or diaphoretic.   HENT:      Head: Normocephalic and atraumatic.      Right Ear: Tympanic membrane, ear canal and external ear normal.      Left Ear: Tympanic membrane, ear canal and external ear normal.      Nose: Nose normal. No congestion or rhinorrhea.      Mouth/Throat:      Mouth: Mucous membranes are moist.      Pharynx: No oropharyngeal exudate or posterior oropharyngeal erythema.   Eyes:      General: No scleral icterus.        Right eye: No discharge.         Left eye: No discharge.      Extraocular Movements: Extraocular movements intact.      Conjunctiva/sclera: Conjunctivae normal.      Pupils: Pupils are equal, round, and reactive to light.   Cardiovascular:      Rate and Rhythm: Normal rate and regular rhythm.      Heart sounds: Normal heart sounds. No murmur heard.   No gallop.    Pulmonary:      Effort: Pulmonary effort is normal. No respiratory distress.      Breath sounds: Normal breath sounds. No wheezing  or rales.   Chest:      Chest wall: No tenderness.   Abdominal:      General: Abdomen is flat. There is no distension.      Palpations: Abdomen is soft. There is  no mass.      Tenderness: There is no abdominal tenderness. There is no right CVA tenderness, left CVA tenderness, guarding or rebound.      Hernia: No hernia is present.      Comments: Negative murphy, negative Mcburney sign   Musculoskeletal:      Cervical back: Neck supple. No muscular tenderness.      Right lower leg: No edema.      Left lower leg: No edema.   Lymphadenopathy:      Cervical: No cervical adenopathy.   Skin:     Coloration: Skin is not pale.      Findings: No rash.   Neurological:      General: No focal deficit present.      Mental Status: He is alert and oriented to person, place, and time.      Cranial Nerves: No cranial nerve deficit.      Sensory: No sensory deficit.      Motor: No weakness.      Gait: Gait normal.   Psychiatric:         Mood and Affect: Mood normal.         Behavior: Behavior normal.         Thought Content: Thought content normal.         Judgment: Judgment normal.              Assessment:    1. Anxiety  - ALPRAZolam (Xanax XR) 0.5 MG 24 hr tablet; Take 1 tablet (0.5 mg total) by mouth daily as needed (anxiety)  Dispense: 30 tablet; Refill: 1    2. Post-nasal drip  - Ambulatory referral to Allergy  - cetirizine (ZyrTEC Allergy) 10 MG tablet; Take 1 tablet (10 mg total) by mouth daily  Dispense: 30 tablet; Refill: 11  - Coronavirus, COVID-19    3. Nausea  - ondansetron (ZOFRAN) 4 MG tablet; Take 1 tablet (4 mg total) by mouth every 8 (eight) hours as needed for Nausea  Dispense: 30 tablet; Refill: 1  - Coronavirus, COVID-19    4. Weakness    5. Essential (primary) hypertension    6. Gastroesophageal reflux disease without esophagitis            Plan:      Essential (primary) hypertension  A/P: Stable, continue current treatment regimen.   Encourage therapeutic lifestyle changes including low fat diet, decrease salt  and increase exercise, monitor blood pressure a few times a week at home, normal blood pressure is 120/80, call or follow office visit if blood pressure continues to be elevated.    Gastroesophageal reflux disease without esophagitis  A/P: Stable, continue current treatment regimen.  F/u with GI    Nausea  A/P: continue zofran as needed    Anxiety  A/P: Stable, continue current treatment regimen.  Discussed with patient that medication can be addictive and is a controlled substance, patient understands and is agreeable to be careful with use.    Post-nasal drip  A/P: refer to allergist.  Add zyrtec    Discussed with pt risk and benefits of Covid 19 testing. Explained of symptoms to monitor for including fever, worsening cough, shortness of breath and abdominal symptoms including nausea and diarrhea. patient to go to ER if worsening symptoms.  .    Weakness  A/P: after eating at times.  Recent labs showed normal a1c.  Pt will f/u with allergist and GI     Patient Instructions     Allergic  Rhinitis  Allergic rhinitis is an allergic reaction that affects the nose, and often the eyes. It's often known asnasal allergies. Nasal allergies are often due to things in the environment that are breathed in. Depending what you are sensitive to, nasal allergies may occur only during certain seasons, or they may occur year round. Common indoor allergens include house dust mites, mold, cockroaches, and pet dander. Outdoor allergens include pollen from trees, grasses, and weeds.   Symptoms include a drippy, stuffy, and itchy nose. They also include sneezing and red and itchy eyes. You may feel tired more often. Severe allergies may also affect your breathing and trigger a condition called asthma.   Tests can be done to see what allergens are affecting you. You may be referred to an allergy specialist for testing and further evaluation.   Home care  Your healthcare provider may prescribe medicines to help relieve allergy symptoms.  These may include oral medicines, nasal sprays, or eye drops.   Ask your provider for advice on how to stay away from substances that you are allergic to.Below are a few tips for each type of allergen.   Pet dander:   Do not have pets with fur and feathers.   If you have a pet, keep it out of your bedroom and off upholstered furniture.  Pollen:   When pollen counts are high, keep windows of your car and home closed. If possible, use an air conditioner instead.   Wear a filter mask when mowing or doing yard work.  House dust mites:   Wash bedding every week in warm water and detergent and dry on a hot setting.   Cover the mattress, box spring, and pillows with allergy covers.   If possible, sleep in a room with no carpet, curtains, or upholstered furniture.  Cockroaches:   Store food in sealed containers.   Remove garbage from the home promptly.   Fix water leaks.  Mold:   Keep humidity low by using a dehumidifier or air conditioner. Keep the dehumidifier and air conditioner clean and free of mold.   Clean moldy areas with bleach and water. Don't mix bleach with other cleaners.  In general:   Vacuum once or twice a week. If possible, use a vacuum with a high-efficiency particulate air (HEPA) filter.   Don't smoke. Stay away from cigarette smoke. Cigarette smoke is an irritant that can make symptoms worse.  Follow-up care  Follow up as advised by the healthcare provider or our staff. If you were referred to an allergy specialist, make this appointment promptly.   When to seek medical advice  Call your healthcare provider or get medical care right away if the following occur:    Coughing   Fever of 100.104F (38C) or higher, or as directed by your healthcare provider   Raised red bumps (hives)   Continuing symptoms, new symptoms, or worsening symptoms  Call 911  Call 911if you have:    Trouble breathing   Severe swelling of the face or severe itching of the eyes or mouth   Wheezing or shortness  of breath   Chest tightness   Dizziness or lightheadedness   Feeling of doom   Stomach pain, bloating, vomiting, or diarrhea  [employer] last reviewed this educational content on 02/04/2018   2000-2021 [employer]. All rights reserved. This information is not intended as a substitute for professional medical care. Always follow your healthcare professional's instructions.  Follow-up:    Return in about 3 months (around 03/23/2020), or if symptoms worsen or fail to improve, for High Cholesterol, HTN.         Maebelle Munroe, DO

## 2019-12-22 NOTE — Patient Instructions (Signed)
Allergic Rhinitis  Allergic rhinitis is an allergic reaction that affects the nose, and often the eyes. It's often known asnasal allergies. Nasal allergies are often due to things in the environment that are breathed in. Depending what you are sensitive to, nasal allergies may occur only during certain seasons, or they may occur year round. Common indoor allergens include house dust mites, mold, cockroaches, and pet dander. Outdoor allergens include pollen from trees, grasses, and weeds.   Symptoms include a drippy, stuffy, and itchy nose. They also include sneezing and red and itchy eyes. You may feel tired more often. Severe allergies may also affect your breathing and trigger a condition called asthma.   Tests can be done to see what allergens are affecting you. You may be referred to an allergy specialist for testing and further evaluation.   Home care  Your healthcare provider may prescribe medicines to help relieve allergy symptoms. These may include oral medicines, nasal sprays, or eye drops.   Ask your provider for advice on how to stay away from substances that you are allergic to.Below are a few tips for each type of allergen.   Pet dander:   Do not have pets with fur and feathers.   If you have a pet, keep it out of your bedroom and off upholstered furniture.  Pollen:   When pollen counts are high, keep windows of your car and home closed. If possible, use an air conditioner instead.   Wear a filter mask when mowing or doing yard work.  House dust mites:   Wash bedding every week in warm water and detergent and dry on a hot setting.   Cover the mattress, box spring, and pillows with allergy covers.   If possible, sleep in a room with no carpet, curtains, or upholstered furniture.  Cockroaches:   Store food in sealed containers.   Remove garbage from the home promptly.   Fix water leaks.  Mold:   Keep humidity low by using a dehumidifier or air conditioner. Keep the dehumidifier and air  conditioner clean and free of mold.   Clean moldy areas with bleach and water. Don't mix bleach with other cleaners.  In general:   Vacuum once or twice a week. If possible, use a vacuum with a high-efficiency particulate air (HEPA) filter.   Don't smoke. Stay away from cigarette smoke. Cigarette smoke is an irritant that can make symptoms worse.  Follow-up care  Follow up as advised by the healthcare provider or our staff. If you were referred to an allergy specialist, make this appointment promptly.   When to seek medical advice  Call your healthcare provider or get medical care right away if the following occur:    Coughing   Fever of 100.4F (38C) or higher, or as directed by your healthcare provider   Raised red bumps (hives)   Continuing symptoms, new symptoms, or worsening symptoms  Call 911  Call 911if you have:    Trouble breathing   Severe swelling of the face or severe itching of the eyes or mouth   Wheezing or shortness of breath   Chest tightness   Dizziness or lightheadedness   Feeling of doom   Stomach pain, bloating, vomiting, or diarrhea  [employer] last reviewed this educational content on 02/04/2018   2000-2021 [employer]. All rights reserved. This information is not intended as a substitute for professional medical care. Always follow your healthcare professional's instructions.

## 2019-12-22 NOTE — Assessment & Plan Note (Signed)
A/P: Stable, continue current treatment regimen.  F/u with GI

## 2019-12-22 NOTE — Assessment & Plan Note (Signed)
A/P: Stable, continue current treatment regimen.   Encourage therapeutic lifestyle changes including low fat diet, decrease salt and increase exercise, monitor blood pressure a few times a week at home, normal blood pressure is 120/80, call or follow office visit if blood pressure continues to be elevated.

## 2019-12-22 NOTE — Assessment & Plan Note (Signed)
A/P: continue zofran as needed

## 2019-12-24 LAB — SARS-COV-2, NAA 2 DAY TAT

## 2019-12-24 LAB — CORONAVIRUS, COVID-19: SARS-CoV-2: NOT DETECTED

## 2019-12-28 ENCOUNTER — Ambulatory Visit (INDEPENDENT_AMBULATORY_CARE_PROVIDER_SITE_OTHER): Payer: Medicare Other | Admitting: Cardiology

## 2019-12-28 ENCOUNTER — Encounter (INDEPENDENT_AMBULATORY_CARE_PROVIDER_SITE_OTHER): Payer: Self-pay | Admitting: Cardiology

## 2019-12-28 VITALS — BP 160/90 | HR 96 | Ht 68.0 in | Wt 156.0 lb

## 2019-12-28 DIAGNOSIS — R002 Palpitations: Secondary | ICD-10-CM

## 2019-12-28 DIAGNOSIS — I447 Left bundle-branch block, unspecified: Secondary | ICD-10-CM

## 2019-12-28 DIAGNOSIS — I2510 Atherosclerotic heart disease of native coronary artery without angina pectoris: Secondary | ICD-10-CM

## 2019-12-28 DIAGNOSIS — E785 Hyperlipidemia, unspecified: Secondary | ICD-10-CM

## 2019-12-28 DIAGNOSIS — I10 Essential (primary) hypertension: Secondary | ICD-10-CM

## 2019-12-28 DIAGNOSIS — I429 Cardiomyopathy, unspecified: Secondary | ICD-10-CM

## 2019-12-28 MED ORDER — OLMESARTAN MEDOXOMIL-HCTZ 20-12.5 MG PO TABS
1.0000 | ORAL_TABLET | Freq: Every day | ORAL | 3 refills | Status: DC
Start: 2019-12-28 — End: 2020-06-09

## 2019-12-28 MED ORDER — OLMESARTAN MEDOXOMIL 20 MG PO TABS
20.0000 mg | ORAL_TABLET | Freq: Every day | ORAL | 3 refills | Status: DC
Start: 2019-12-28 — End: 2020-06-09

## 2019-12-28 NOTE — Progress Notes (Signed)
Great news, Covid nasal swab is negative, no virus detected

## 2019-12-28 NOTE — Progress Notes (Signed)
Reviewed

## 2019-12-28 NOTE — Progress Notes (Signed)
[facility] OFF[facility] PROGRESS NOTE    [facility] OFFICE -CARDIOLOGY  [address]  [place] [address]  Dept: [phone]  Dept Fax: [phone]       Patient Name: Wesley [name], Wesley [name]    Date of Visit:  December 28, 2019  Date of Birth: July 09, 1940  AGE: 79 y.o.  Medical Record #: [id number]  Requesting Physician: Maebelle Munroe, DO      CHIEF COMPLAINT: Hypertension      HISTORY OF PRESENT ILLNESS:    He is a pleasant 79 y.o. male who presents today for reports of elevated HR noted on his new watch 130-150's on when he exercises or goes up the stairs several times.  He says he doesn't have any symptoms.  He was seen several months ago and reported elevated HR at times with exercise 120-130's completely asymptomatic.  He does have a known LBBB and well compensated CM with EF 45%.  His HR decreases quickly without exercise and he denies symptoms.     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  He walks the equivalent of 100 steps per day or more.  100-120's/50s to 70s.    He is not sure if he took his Coreg this morning.  In addition he took Sudafed along with some Tussionex and antihistamines yesterday because he had a "allergy" attack.      PAST MEDICAL HISTORY: He has a past medical history of Cardiomyopathy, Collagenous colitis, echocardiogram (01/2010, 03/2015, 05/2017, 09/2018), Gastroesophageal reflux disease, heart scan, Hyperlipidemia, Hypertension, Hypothyroidism, LBBB (left bundle branch block), MPI ST (2001, 2011), Mumps, Seasonal allergic rhinitis, and Vitamin D deficiency. He has a past surgical history that includes EGD (10/31/2018); Colonoscopy (06/21/2017); TONSILLECTOMY (05/07/1944); and Cardiac catheterization (09/2010).    ALLERGIES: No Known Allergies    MEDICATIONS:   Current Outpatient Medications   Medication Sig   . ALPRAZolam (Xanax XR) 0.5 MG 24 hr tablet Take 1 tablet (0.5 mg total) by mouth daily as  needed (anxiety)   . atorvastatin (LIPITOR) 20 MG tablet Take 1 tablet (20 mg total) by mouth daily   . carvedilol (Coreg) 6.25 MG tablet Take 1 tablet (6.25 mg total) by mouth 2 (two) times daily   . Cholecalciferol (VITAMIN D) 2000 UNITS Cap Take by mouth 5000 unit     . Coenzyme Q10 (CO Q 10) 10 MG Cap Take by mouth daily      . ezetimibe (ZETIA) 10 MG tablet Take 1 tablet (10 mg total) by mouth daily   . Levoxyl 125 MCG tablet Take 1 tablet (125 mcg total) by mouth Once a day at 6:00am   . lidocaine (Lidoderm) 5 % Apply 1 patch for up to 12 hours a day, Remove & Discard patch within 12 hours (Patient taking differently: as needed Apply 1 patch for up to 12 hours a day, Remove & Discard patch within 12 hours  )   . loperamide (IMODIUM) 2 MG capsule Take 2 mg by mouth as needed for Diarrhea      . olmesartan (BENICAR) 20 MG tablet Take 1 tablet (20 mg total) by mouth daily 4 days a week.   . olmesartan-hydrochlorothiazide (BENICAR HCT) 20-12.5 MG per tablet Take 1 tablet by mouth daily 3 days a week.   . ondansetron (ZOFRAN) 4 MG tablet Take 1 tablet (4 mg total) by mouth every 8 (eight) hours as needed for Nausea   .  cetirizine (ZyrTEC Allergy) 10 MG tablet Take 1 tablet (10 mg total) by mouth daily   . hydrocortisone 2.5 % cream PLACE RECTALLY TWICE A DAY   . testosterone (ANDROGEL) 50 MG/5GM (1%) Gel Place 5 g onto the skin daily 1 pack, 4 day /week          FAMILY HISTORY: family history includes Colon cancer in an other family member; Diabetes in his father; Migraines in his mother; Stomach cancer in his sister; Thyroid disease (age of onset: 26) in his sister.    SOCIAL HISTORY: He reports that he has quit smoking. He smoked 1.00 pack per day. He has never used smokeless tobacco. He reports that he does not drink alcohol and does not use drugs.    PHYSICAL EXAMINATION    Visit Vitals  BP 160/90 (BP Site: Left arm, Patient Position: Sitting, Cuff Size: Medium)   Pulse 96   Ht 1.727 m (5\' 8" )   Wt 70.8 kg (156  lb)   BMI 23.72 kg/m       General Appearance:  A well-appearing, in no acute distress.    Skin: Warm and dry to touch  Head: Normocephalic, normal hair pattern  Eyes:  conjunctivae and lids unremarkable.  ENT: mask   Neck: JVP normal, no carotid bruit, thyroid not enlarged   Chest: Clear to auscultation bilaterally with good air movement and respiratory effort and no wheezes, rales, or rhonchi   Cardiovascular: Regular rhythm, S1 normal, S2 normal, No S3 or S4, Apical impulse not displaced. No murmurs. No gallops or rubs detected   Abdomen: Soft, nontender, nondistended, with normoactive bowel sounds  Extremities: Warm without edema. No clubbing, or cyanosis. All peripheral pulses are full and equal.   Neuro: Alert and oriented x3. No gross motor or sensory deficits noted, affect appropriate.        ECG: NA      LABS:   No results found for: WBC, HGB, HCT, PLT  No results found for: GLU, BUN, CREAT, NA, K, CL, CO2, AST, ALT  No results found for: MG, TSH, HGBA1C, BNP  No results found for: CHOL, TRIG, HDL, LDL         Most recent echo and nuclear study reviewed.      IMPRESSION:   Wesley [name] is a 79 y.o. male with the following problems:      IMPRESSIONS:  1.         Noted elevated HR without symptoms with exercise and exertion with quick recovery.  Today in part from using Sudafed as well as probably missing his morning Coreg.  He states he generally takes the Coreg.  2.         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  ACC Stage B; NYHA Class I. Euvolemic and stable from volume standpoint.                           a.         Echocardiogram in September of 2011, borderline LV systolic function,approximately 55%.  Echo Nov 2016, EF 55-60%. Echo 06/04/2017,EF down to 35-40%; to 50% June 2019.                          b.         Echo May 2020 LVEF 40-45%; ~45%(Biplane 47%) July 2021.  2.         Nonobstructive coronary atherosclerosis.  a.         History of a CAC score  elevated.                          b.         Reassuring cardiac catheterization on Sep 12, 2010, EF of 40-45% with near normal coronary arteries.  3.         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                           a.         Possible diastolic dysfunction by prior echo.   4.         History of false positive MPI in 2011 by stress Bruce protocol MPI.  With this demonstrated moderate-sized apical septal/septal perfusion defect; however, the patient has underlying left bundle branch block and this was performed as an exercise study.  5.         Mild carotid atherosclerosis by duplex study in September of 2011.  No significant stenosis.  6.         Other medical issues:                          a.         Recent situational work stresses, improving.                          b.         Chronic lower back pain for which he receives periodic injections.  He gets hiccups and takes Zofran prophylactically.       RECOMMENDATIONS:  1.         Continue diet, encourage exercise as able and as directed.                            -Continued aggressive lifestyle changes.  2.         COVID-19 precautions discussed.   He has been vaccinated  3.         Continue medicines as prescribed.  Take meds-use a pill box.  4.         The following tests have been recommended:     Additional testing based on the results of the above.  5.         Office follow-up with     APP(NP/PA) ~ 49month    Dr Clarita Crane cardiologist ~6 months  6.         Follow-up and copy of the above note to PCP and specialists.  7.         Low salt diet (2g Na), monitor bp.  Target <135/85.     Avoid stimulants.    This note was generated by the Epic system/Dragon speech recognition and may contain errors or omissions not intended by the user. Grammatical errors, random word insertions, deletions, pronoun errors, and incomplete sentences are occasional consequences of this technology due to software limitations. Not all errors  are caught or corrected. If there are questions or concerns about the content of this note or information contained within the body of this dictation, they should be addressed directly with the author for clarification.  No orders of the defined types were placed in this encounter.      No orders of the defined types were placed in this encounter.      SIGNED:    Lamount Cranker, MD          This note was generated by the Dragon speech recognition and may contain errors or omissions not intended by the user. Grammatical errors, random word insertions, deletions, pronoun errors, and incomplete sentences are occasional consequences of this technology due to software limitations. Not all errors are caught or corrected. If there are questions or concerns about the content of this note or information contained within the body of this dictation, they should be addressed directly with the author for clarification.

## 2020-01-01 ENCOUNTER — Other Ambulatory Visit (INDEPENDENT_AMBULATORY_CARE_PROVIDER_SITE_OTHER): Payer: Self-pay | Admitting: Cardiovascular Disease

## 2020-01-04 ENCOUNTER — Telehealth (INDEPENDENT_AMBULATORY_CARE_PROVIDER_SITE_OTHER): Payer: Self-pay | Admitting: Family Medicine

## 2020-01-04 ENCOUNTER — Encounter (INDEPENDENT_AMBULATORY_CARE_PROVIDER_SITE_OTHER): Payer: Self-pay | Admitting: Family Medicine

## 2020-01-04 DIAGNOSIS — E785 Hyperlipidemia, unspecified: Secondary | ICD-10-CM

## 2020-01-04 NOTE — Telephone Encounter (Signed)
Refill Requests 08.30.2021 scanned into patient chart and placed in folder.

## 2020-01-05 MED ORDER — EZETIMIBE 10 MG PO TABS
10.0000 mg | ORAL_TABLET | Freq: Every day | ORAL | 1 refills | Status: DC
Start: 2020-01-05 — End: 2020-04-18

## 2020-01-05 MED ORDER — ATORVASTATIN CALCIUM 20 MG PO TABS
20.0000 mg | ORAL_TABLET | Freq: Every day | ORAL | 1 refills | Status: DC
Start: 2020-01-05 — End: 2020-04-18

## 2020-01-05 NOTE — Telephone Encounter (Signed)
sent 

## 2020-01-05 NOTE — Telephone Encounter (Signed)
Please advise, ty.

## 2020-01-12 ENCOUNTER — Encounter (INDEPENDENT_AMBULATORY_CARE_PROVIDER_SITE_OTHER): Payer: Self-pay | Admitting: Physician Assistant

## 2020-01-12 ENCOUNTER — Ambulatory Visit (INDEPENDENT_AMBULATORY_CARE_PROVIDER_SITE_OTHER): Payer: Medicare Other | Admitting: Physician Assistant

## 2020-01-12 ENCOUNTER — Encounter (INDEPENDENT_AMBULATORY_CARE_PROVIDER_SITE_OTHER): Payer: Self-pay | Admitting: Family Medicine

## 2020-01-12 VITALS — BP 122/64 | HR 87 | Temp 97.6°F | Resp 16 | Ht 68.0 in | Wt 167.6 lb

## 2020-01-12 DIAGNOSIS — M48062 Spinal stenosis, lumbar region with neurogenic claudication: Secondary | ICD-10-CM

## 2020-01-12 DIAGNOSIS — Z01818 Encounter for other preprocedural examination: Secondary | ICD-10-CM

## 2020-01-12 DIAGNOSIS — Z23 Encounter for immunization: Secondary | ICD-10-CM

## 2020-01-12 NOTE — Patient Instructions (Signed)
Copy of EKG given to pt

## 2020-01-12 NOTE — Progress Notes (Addendum)
[facility]   Family Medicine                       Date of Exam: 01/12/2020 10:10 AM        Patient ID: Wesley [name] is a 79 y.o. male.  Attending Physician: Kennis Carina, PA        Chief Complaint:    Chief Complaint   Patient presents with   . Pre-op Exam               HPI:    L4-L5 and L5-S1.   August didn't last long x 2 times. Indicated decompression.     MRI- scheduled Wednesday. Open MRI. Xray for flexion and extension.   Cardio- echo 7/9. 45% EF.  Coreg with improvmenet with EF. Cardio- stable. Pt saw cardio 2 weeks ago- doing very well. Recheck in December- due to MD retiring.   Pt 2/3 days- very active.     Visit Type: Pre-operative Evaluation  Surgery/Procedure: lumbar fusion  Indication for Surgery: back pain, spinal stenosis  Date of Surgery: 02/05/2020  Surgeon: Dr. Adin Hector  Fax Number: [phone]  Surgical Risk Factors: LBBB, PVC.   Prior Anesthesia: No hx of anesthesia.          Problem List:    Patient Active Problem List   Diagnosis   . Left bundle branch block (LBBB)   . Anxiety   . Cervical disc disorder   . Claustrophobia   . Hemangioma of liver   . Hyperlipidemia LDL goal <70   . Other specified hypothyroidism   . Nausea   . Neck pain on left side   . Vitamin D deficiency   . Abnormal glucose   . Cardiomyopathy   . Collagenous colitis   . Rib pain on right side   . Degenerative lumbar spinal stenosis   . Gastroesophageal reflux disease without esophagitis   . Left hip pain   . Localized osteoarthritis of left knee   . Nonobstructive atherosclerosis of coronary artery   . Spondylosis of lumbar spine   . Lumbosacral radiculopathy   . Tremor of both hands   . Other specified anemias   . Tinea pedis of right foot   . Chronic pain of left knee   . Other hemorrhoids   . Sebaceous cyst   . Essential (primary) hypertension   . COVID-19 virus IgG antibody test result unknown   . Weakness             Current Meds:    Outpatient Medications Marked as Taking for the 01/12/20  encounter (Surgical Consult) with Kennis Carina, PA   Medication Sig Dispense Refill   . ALPRAZolam (Xanax XR) 0.5 MG 24 hr tablet Take 1 tablet (0.5 mg total) by mouth daily as needed (anxiety) 30 tablet 1   . atorvastatin (LIPITOR) 20 MG tablet Take 1 tablet (20 mg total) by mouth daily 90 tablet 1   . carvedilol (Coreg) 6.25 MG tablet Take 1 tablet (6.25 mg total) by mouth 2 (two) times daily 180 tablet 3   . cetirizine (ZyrTEC Allergy) 10 MG tablet Take 1 tablet (10 mg total) by mouth daily 30 tablet 11   . Cholecalciferol (VITAMIN D) 2000 UNITS Cap Take by mouth 5000 unit       . Coenzyme Q10 (CO Q 10) 10 MG Cap Take by mouth daily        . ezetimibe (ZETIA) 10 MG tablet Take  1 tablet (10 mg total) by mouth daily 90 tablet 1   . hydrocortisone 2.5 % cream PLACE RECTALLY TWICE A DAY 28 g 0   . Levoxyl 125 MCG tablet Take 1 tablet (125 mcg total) by mouth Once a day at 6:00am 90 tablet 1   . lidocaine (Lidoderm) 5 % Apply 1 patch for up to 12 hours a day, Remove & Discard patch within 12 hours (Patient taking differently: as needed Apply 1 patch for up to 12 hours a day, Remove & Discard patch within 12 hours  ) 30 patch 3   . loperamide (IMODIUM) 2 MG capsule Take 2 mg by mouth as needed for Diarrhea        . olmesartan (BENICAR) 20 MG tablet Take 1 tablet (20 mg total) by mouth daily 4 days a week. 90 tablet 3   . olmesartan-hydrochlorothiazide (BENICAR HCT) 20-12.5 MG per tablet Take 1 tablet by mouth daily 3 days a week. 90 tablet 3   . ondansetron (ZOFRAN) 4 MG tablet Take 1 tablet (4 mg total) by mouth every 8 (eight) hours as needed for Nausea 30 tablet 1   . testosterone (ANDROGEL) 50 MG/5GM (1%) Gel Place 5 g onto the skin daily 1 pack, 4 day /week              Allergies:    No Known Allergies          Past Surgical History:    Past Surgical History:   Procedure Laterality Date   . CARDIAC CATHETERIZATION  09/2010   . COLONOSCOPY  06/21/2017   . EGD  10/31/2018    Dr. Rosalyn Charters- erythema   .  TONSILLECTOMY  05/07/1944           Family History:    Family History   Problem Relation Age of Onset   . Migraines Mother    . Stomach cancer Sister         Died age 36   . Thyroid disease Sister 77   . Colon cancer Other         Cousin   . Diabetes Father            Social History:    Social History     Tobacco Use   . Smoking status: Former Smoker     Packs/day: 1.00   . Smokeless tobacco: Never Used   . Tobacco comment: quit 40 years ago    Vaping Use   . Vaping Use: Never used   Substance Use Topics   . Alcohol use: Never   . Drug use: Never          The following sections were reviewed this encounter by the provider:   Tobacco  Allergies  Meds  Problems  Med Hx  Surg Hx  Fam Hx             Vital Signs:    BP 122/64 (BP Site: Left arm, Patient Position: Sitting, Cuff Size: Large)   Pulse 87   Temp 97.6 F (36.4 C) (Temporal)   Resp 16   Ht 1.727 m (5\' 8" )   Wt 76 kg (167 lb 9.6 oz)   SpO2 97%   BMI 25.48 kg/m          ROS:    Review of Systems   Constitutional: Negative for activity change and fever.   HENT: Negative for congestion, sinus pain and sore throat.    Respiratory: Negative for cough and  shortness of breath.    Cardiovascular: Negative for chest pain.   Gastrointestinal: Negative for abdominal pain.   Skin: Negative for rash.   Neurological: Negative for dizziness.   Psychiatric/Behavioral: Negative for dysphoric mood.            Physical Exam:    Physical Exam  Constitutional:       Appearance: Normal appearance.   HENT:      Head: Normocephalic.      Right Ear: Tympanic membrane, ear canal and external ear normal.      Left Ear: Tympanic membrane, ear canal and external ear normal.      Nose: Nose normal.      Mouth/Throat:      Mouth: Mucous membranes are moist.      Pharynx: Oropharynx is clear.   Eyes:      Conjunctiva/sclera: Conjunctivae normal.      Pupils: Pupils are equal, round, and reactive to light.   Cardiovascular:      Rate and Rhythm: Normal rate and regular rhythm.       Heart sounds: Normal heart sounds.   Pulmonary:      Effort: Pulmonary effort is normal.      Breath sounds: Normal breath sounds.   Abdominal:      General: Abdomen is flat. Bowel sounds are normal.      Palpations: Abdomen is soft.   Musculoskeletal:      Cervical back: Normal range of motion.      Right lower leg: No edema.      Left lower leg: No edema.   Skin:     General: Skin is warm and dry.   Neurological:      Mental Status: He is alert and oriented to person, place, and time.   Psychiatric:         Mood and Affect: Mood normal.         Behavior: Behavior normal.              Assessment/ Plan:    1. Pre-op evaluation  - Basic Metabolic Panel  - CBC and differential  - ECG 12 lead    2. Immunization due  - Flu vaccine HIGH-DOSE QUAD (PF) 65 yrs and older    3. Spinal stenosis of lumbar region with neurogenic claudication    Problem   Post-Nasal Drip (Resolved)     EKG Findings:   LBBB. EKG compared to 2015 - flipped T in AvL unchanged.     Cleared for surgery. [facility] 01/13/2020.         Follow-up:    Return for Annual Wellness Exam- Medicare.         Kennis Carina, PA

## 2020-01-13 ENCOUNTER — Encounter (INDEPENDENT_AMBULATORY_CARE_PROVIDER_SITE_OTHER): Payer: Self-pay | Admitting: Family Medicine

## 2020-01-13 LAB — BASIC METABOLIC PANEL
African American eGFR: 99 mL/min/{1.73_m2} (ref >59)
BUN / Creatinine Ratio: 16 (ref 10–24)
BUN: 13 mg/dL (ref 8–27)
CO2: 26 mmol/L (ref 20–29)
Calcium: 9.2 mg/dL (ref 8.6–10.2)
Chloride: 98 mmol/L (ref 96–106)
Creatinine: 0.79 mg/dL (ref 0.76–1.27)
Glucose: 96 mg/dL (ref 65–99)
Potassium: 4.5 mmol/L (ref 3.5–5.2)
Sodium: 138 mmol/L (ref 134–144)
non-African American eGFR: 86 mL/min/{1.73_m2} (ref >59)

## 2020-01-13 LAB — CBC AND DIFFERENTIAL
Baso(Absolute): 0.1 10*3/uL (ref 0.0–0.2)
Basos: 1 %
Eos: 3 %
Eosinophils Absolute: 0.1 10*3/uL (ref 0.0–0.4)
Hematocrit: 40.1 % (ref 37.5–51.0)
Hemoglobin: 12.8 g/dL — ABNORMAL LOW (ref 13.0–17.7)
Immature Granulocytes Absolute: 0.1 10*3/uL (ref 0.0–0.1)
Immature Granulocytes: 1 %
Lymphocytes Absolute: 1.2 10*3/uL (ref 0.7–3.1)
Lymphocytes: 22 %
MCH: 26.6 pg (ref 26.6–33.0)
MCHC: 31.9 g/dL (ref 31.5–35.7)
MCV: 83 fL (ref 79–97)
Monocytes Absolute: 0.7 10*3/uL (ref 0.1–0.9)
Monocytes: 14 %
Neutrophils Absolute: 3.1 10*3/uL (ref 1.4–7.0)
Neutrophils: 59 %
Platelets: 232 10*3/uL (ref 150–450)
RBC: 4.82 x10E6/uL (ref 4.14–5.80)
RDW: 13.5 % (ref 11.6–15.4)
WBC: 5.3 10*3/uL (ref 3.4–10.8)

## 2020-01-13 NOTE — Progress Notes (Signed)
Hi Mr. Lohmeyer,     Nurse- please send pre op clearance to surgeon.     The recent lab results show the following:   Normal labs cleared for surgery.    Diogenes Whirley PA-C

## 2020-01-18 ENCOUNTER — Encounter (INDEPENDENT_AMBULATORY_CARE_PROVIDER_SITE_OTHER): Payer: Self-pay | Admitting: Family Medicine

## 2020-01-18 DIAGNOSIS — M4310 Spondylolisthesis, site unspecified: Secondary | ICD-10-CM | POA: Insufficient documentation

## 2020-01-18 DIAGNOSIS — M5136 Other intervertebral disc degeneration, lumbar region: Secondary | ICD-10-CM | POA: Insufficient documentation

## 2020-01-18 NOTE — Progress Notes (Signed)
Reviewed

## 2020-01-27 ENCOUNTER — Encounter (INDEPENDENT_AMBULATORY_CARE_PROVIDER_SITE_OTHER): Payer: Self-pay | Admitting: Family Medicine

## 2020-01-27 ENCOUNTER — Other Ambulatory Visit (INDEPENDENT_AMBULATORY_CARE_PROVIDER_SITE_OTHER): Payer: Self-pay | Admitting: Family Medicine

## 2020-01-27 DIAGNOSIS — E038 Other specified hypothyroidism: Secondary | ICD-10-CM

## 2020-01-27 NOTE — Progress Notes (Signed)
Reviewed

## 2020-01-29 ENCOUNTER — Encounter (INDEPENDENT_AMBULATORY_CARE_PROVIDER_SITE_OTHER): Payer: Self-pay | Admitting: Nurse Practitioner

## 2020-01-29 ENCOUNTER — Ambulatory Visit (INDEPENDENT_AMBULATORY_CARE_PROVIDER_SITE_OTHER): Payer: Medicare Other | Admitting: Nurse Practitioner

## 2020-01-29 VITALS — BP 126/74 | HR 82 | Ht 68.0 in | Wt 168.2 lb

## 2020-01-29 DIAGNOSIS — I447 Left bundle-branch block, unspecified: Secondary | ICD-10-CM

## 2020-01-29 DIAGNOSIS — I428 Other cardiomyopathies: Secondary | ICD-10-CM

## 2020-01-29 DIAGNOSIS — I6529 Occlusion and stenosis of unspecified carotid artery: Secondary | ICD-10-CM

## 2020-01-29 DIAGNOSIS — I2510 Atherosclerotic heart disease of native coronary artery without angina pectoris: Secondary | ICD-10-CM

## 2020-01-29 DIAGNOSIS — I10 Essential (primary) hypertension: Secondary | ICD-10-CM

## 2020-01-29 MED ORDER — CARVEDILOL 6.25 MG PO TABS
6.2500 mg | ORAL_TABLET | Freq: Two times a day (BID) | ORAL | 3 refills | Status: DC
Start: 2020-01-29 — End: 2020-06-09

## 2020-01-29 NOTE — Progress Notes (Signed)
[place] [facility] CARDIOLOG[facility] OFF[facility] PROGRESS NOTE    [facility]RT [facility] [place] [facility] -CARDIOLOGY  [address]  [place] [address]  Dept: [phone]  Dept Fax: [phone]       Patient Name: Wesley Wesley [name]    Date of Visit:  January 29, 2020  Date of Birth: 1941-01-25  Wesley [name]: 79 y.o.  Medical Record #: [id number]  Requesting Physician: Maebelle Munroe, DO      CHIEF COMPLAINT: Hypertension      HISTORY OF PRESENT ILLNESS:    He is a pleasant 80 y.o. male who presents today for blood pressure and heart rate follow-up.  Patient was seen by Dr. Octavio Graves approximately 1 month ago with complaints of elevated heart rate during exercise.  Patient has been keeping a close track of his both heart rate and blood pressure since then and is discovered that his blood pressure averages 120s over 70s and his heart rate averages 70s to 80s at rest.  Patient states that he still notices increased heart rate in the 150s during exercise however it quickly comes down at rest.  Patient exercises almost daily and does recumbent bike 30 to 45 minutes without any associated chest pain or shortness of breath.  Patient denies any associated cardiac symptom with increased heart rate.  Patient has no other cardiac complaints today.      PAST MEDICAL HISTORY: He has a past medical history of Cardiomyopathy, Collagenous colitis, echocardiogram (01/2010, 03/2015, 05/2017, 09/2018), Gastroesophageal reflux disease, heart scan, Hyperlipidemia, Hypertension, Hypothyroidism, LBBB (left bundle branch block), MPI ST (2001, 2011), Mumps, Seasonal allergic rhinitis, and Vitamin D deficiency. He has a past surgical history that includes EGD (10/31/2018); Colonoscopy (06/21/2017); TONSILLECTOMY (05/07/1944); and Cardiac catheterization (09/2010).    ALLERGIES: No Known Allergies    MEDICATIONS:   Current Outpatient Medications   Medication Sig   . ALPRAZolam (Xanax XR) 0.5 MG 24 hr tablet Take 1 tablet (0.5 mg total) by mouth daily as  needed (anxiety)   . atorvastatin (LIPITOR) 20 MG tablet Take 1 tablet (20 mg total) by mouth daily   . carvedilol (Coreg) 6.25 MG tablet Take 1 tablet (6.25 mg total) by mouth 2 (two) times daily   . cetirizine (ZyrTEC Allergy) 10 MG tablet Take 1 tablet (10 mg total) by mouth daily   . Cholecalciferol (VITAMIN D) 2000 UNITS Cap Take 5,000 Unit by mouth daily      . Coenzyme Q10 (CO Q 10) 10 MG Cap Take by mouth daily      . ezetimibe (ZETIA) 10 MG tablet Take 1 tablet (10 mg total) by mouth daily   . hydrocortisone 2.5 % cream PLACE RECTALLY TWICE A DAY   . Levoxyl 125 MCG tablet TAKE ONE TABLET BY MOUTH DAILY AT 6AM   . lidocaine (Lidoderm) 5 % Apply 1 patch for up to 12 hours a day, Remove & Discard patch within 12 hours (Patient taking differently: as needed Apply 1 patch for up to 12 hours a day, Remove & Discard patch within 12 hours  )   . loperamide (IMODIUM) 2 MG capsule Take 2 mg by mouth as needed for Diarrhea      . olmesartan (BENICAR) 20 MG tablet Take 1 tablet (20 mg total) by mouth daily 4 days a week.   . olmesartan-hydrochlorothiazide (BENICAR HCT) 20-12.5 MG per tablet Take 1 tablet by mouth daily 3 days a week.   . ondansetron (ZOFRAN) 4 MG tablet Take 1 tablet (4 mg  total) by mouth every 8 (eight) hours as needed for Nausea   . testosterone (ANDROGEL) 50 MG/5GM (1%) Gel Place 5 g onto the skin daily 1 pack, 4 day /week          FAMILY HISTORY: family history includes Colon cancer in an other family member; Diabetes in his father; Migraines in his mother; Stomach cancer in his sister; Thyroid disease (Wesley [name] of onset: 39) in his sister.    SOCIAL HISTORY: He reports that he has quit smoking. He smoked 1.00 pack per day. He has never used smokeless tobacco. He reports that he does not drink alcohol and does not use drugs.    PHYSICAL EXAMINATION    Visit Vitals  BP 126/74 (BP Site: Left arm, Patient Position: Sitting, Cuff Size: Medium)   Pulse 82   Ht 1.727 m (5\' 8" )   Wt 76.3 kg (168 lb 3.2 oz)    BMI 25.57 kg/m       General Appearance:  A well-appearing male in no acute distress.    Skin: Warm and dry to touch, no apparent skin lesions, or masses noted.  Head: Normocephalic, normal hair pattern, no masses or tenderness   Eyes: EOMS Intact, PERRL, conjunctivae and lids unremarkable.  ENT: Ears, Nose and throat reveal no gross abnormalities.  No pallor or cyanosis.  Dentition good.   Neck: JVP normal, no carotid bruit, thyroid not enlarged   Chest: Clear to auscultation bilaterally with good air movement and respiratory effort and no wheezes, rales, or rhonchi   Cardiovascular: Regular rhythm, S1 normal, S2 normal, No S3 or S4. Apical impulse not displaced. No murmur. No gallops or rubs detected   Abdomen: Soft, nontender, nondistended, with normoactive bowel sounds. No organomegaly.  No pulsatile masses, or bruits.   Extremities: Warm without edema. No clubbing, or cyanosis. All peripheral pulses are full and equal.   Neuro: Alert and oriented x3. No gross motor or sensory deficits noted, affect appropriate.          LABS:   Lab Results   Component Value Date    WBC 5.3 01/12/2020    HGB 12.8 (L) 01/12/2020    HCT 40.1 01/12/2020    PLT 232 01/12/2020     Lab Results   Component Value Date    GLU 96 01/12/2020    BUN 13 01/12/2020    CREAT 0.79 01/12/2020    NA 138 01/12/2020    K 4.5 01/12/2020    CL 98 01/12/2020    CO2 26 01/12/2020     No results found for: MG, TSH, HGBA1C, BNP  No results found for: CHOL, TRIG, HDL, LDL        Most recent echo and nuclear study reviewed.      IMPRESSION:   Wesley Wesley [name] is a 79 y.o. male with the following problems:    1.         Noted elevated HR without symptoms with exercise-normal physiological response to exercise.    2.         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  ACC Stage B; NYHA Class I. Euvolemic and stable from volume standpoint.                           a.         Echocardiogram in September of 2011, borderline LV systolic  function,approximately 55%.  Echo Nov 2016, EF 55-60%. Echo 06/04/2017,EF down to 35-40%;  to 50% June 2019.                          b.         Echo May 2020 LVEF 40-45%; ~45%(Biplane 47%) July 2021.  2.         Nonobstructive coronary atherosclerosis.                          a.         History of a CAC score elevated.                          b.         Reassuring cardiac catheterization on Sep 12, 2010, EF of 40-45% with near normal coronary arteries.  3.         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                           a.         Possible diastolic dysfunction by prior echo.   4.         History of false positive MPI in 2011 by stress Bruce protocol MPI.  With this demonstrated moderate-sized apical septal/septal perfusion defect; however, the patient has underlying left bundle branch block and this was performed as an exercise study.  5.         Mild carotid atherosclerosis by duplex study in September of 2011.  No significant stenosis.  6.         Other medical issues:                          a.         Recent situational work stresses, improving.                          b.         Chronic lower back pain for which he receives periodic injections.  He gets hiccups and takes Zofran prophylactically.      RECOMMENDATIONS:     Continue Coreg and olmesartan for his hypertension and mild cardiomyopathy.   Advised patient to continue with Lipitor and add 81 mg aspirin given his history of mild carotid atherosclerosis but also recently seen aortic calcification on his chest x-ray.   Continue increase cardiovascular activity as body allows   Continue to follow PCP for routine exam and blood work   Follow with Dr. Malachi Bonds in 6 months or sooner if needed                                                     No orders of the defined types were placed in this encounter.      No orders of the defined types were placed in this encounter.      SIGNED:    Joylene Igo, FNP          This  note was generated by the Dragon speech recognition and may contain errors or omissions not intended by the user. Grammatical errors, random word insertions, deletions,  pronoun errors, and incomplete sentences are occasional consequences of this technology due to software limitations. Not all errors are caught or corrected. If there are questions or concerns about the content of this note or information contained within the body of this dictation, they should be addressed directly with the author for clarification.

## 2020-01-29 NOTE — Progress Notes (Signed)
Reviewed

## 2020-02-23 ENCOUNTER — Encounter (INDEPENDENT_AMBULATORY_CARE_PROVIDER_SITE_OTHER): Payer: Self-pay | Admitting: Family Medicine

## 2020-03-04 ENCOUNTER — Telehealth: Payer: Medicare Other

## 2020-03-04 ENCOUNTER — Encounter (INDEPENDENT_AMBULATORY_CARE_PROVIDER_SITE_OTHER): Payer: Self-pay | Admitting: Physician Assistant

## 2020-03-04 ENCOUNTER — Encounter: Payer: Self-pay | Admitting: Neurological Surgery

## 2020-03-04 ENCOUNTER — Other Ambulatory Visit: Payer: Self-pay | Admitting: Internal Medicine

## 2020-03-04 ENCOUNTER — Ambulatory Visit (INDEPENDENT_AMBULATORY_CARE_PROVIDER_SITE_OTHER): Payer: Medicare Other | Admitting: Physician Assistant

## 2020-03-04 VITALS — BP 120/78 | HR 80 | Temp 98.0°F | Ht 68.0 in | Wt 168.0 lb

## 2020-03-04 DIAGNOSIS — E559 Vitamin D deficiency, unspecified: Secondary | ICD-10-CM

## 2020-03-04 DIAGNOSIS — E785 Hyperlipidemia, unspecified: Secondary | ICD-10-CM

## 2020-03-04 DIAGNOSIS — M25562 Pain in left knee: Secondary | ICD-10-CM

## 2020-03-04 DIAGNOSIS — Z01818 Encounter for other preprocedural examination: Secondary | ICD-10-CM

## 2020-03-04 DIAGNOSIS — G8929 Other chronic pain: Secondary | ICD-10-CM

## 2020-03-04 MED ORDER — LIDOCAINE 5 % EX PTCH
MEDICATED_PATCH | CUTANEOUS | 3 refills | Status: DC
Start: 2020-03-04 — End: 2020-03-04

## 2020-03-04 MED ORDER — LIDOCAINE 5 % EX PTCH
MEDICATED_PATCH | CUTANEOUS | 3 refills | Status: DC
Start: 2020-03-04 — End: 2020-04-18

## 2020-03-04 NOTE — Addendum Note (Signed)
Addended by: Mechele Collin on: 03/04/2020 12:32 PM     Modules accepted: Orders

## 2020-03-04 NOTE — Pre-Procedure Instructions (Signed)
   Anesthesia Guidelines:     Diuretic; lytes   Spine surgery; MRSA/MSSA N/T within 21 days   UA with reflex to C&S   Hypothyroid - TSH with reflex FT4   Surgeon Requirement:     Noted from posting/surgeon order in EPIC:  Med   Per pt, surgeon requires:med eval, labs, ekg, cxr   Specialist Notes / Test Results / Records Requested:   LABS: in epic 01/12/2020 BMP G 96, CBC WDL   TSH, FT4, T3, HGA1C, done 11/02/2019 at [facility]   Pre-op Labs; 03/04/2020 at [facility] - CBC, CMP, PT/PTT, MRSA/MSSA, vitamin D in process   EKG: 01/13/2020 ABN no change from 2018 (Media)   ECHO: May 2020    PCP: 03/04/2020 Pre-op Med Eval and LABs   Cardiol LOV 01/29/2020 in epic   Future Plan / Upcoming Appointments:     CXR at [facility] center 03/04/2020   Email:  email sent to patient per his request [email or web address] with spine video link and map of [facility]EC    Recent Hospitalizations / ED visits: none    Labs/Testing at [facility]:  Pt will come to [facility] 11/1 or 03/08/2020, for   UA with reflex to C&S,  TSH with reflex FT4 if not done by [facility] center 11/01/2019   MRSA/MSSA if not done correctly by [facility].  Needs spine goody bag

## 2020-03-04 NOTE — H&P (View-Only) (Signed)
[facility]   Family Medicine                       Date of Exam: 03/04/2020 12:32 PM        Patient ID: Wesley [name] is a 79 y.o. male.  Attending Physician: Kennis Carina, PA        Chief Complaint:    Chief Complaint   Patient presents with   . Pre-op Exam               HPI:    79 y.o. M presents today for pre-op for Lumbar L4-L5, L5-S1 foraminotomy on 03/17/2020.        Visit Type: Pre-operative Evaluation  Surgery/Procedure: Lumbar L4-L5, L5-S1 foraminotomy  Indication for Surgery: back pain   Date of Surgery: 03/17/2020  Surgeon: Dr. Nicoletta Dress  Fax Number: [phone]  Surgical Risk Factors: hypertension  Prior Anesthesia: Patient reports no adverse reaction to anesthesia in the past.           Problem List:    Patient Active Problem List   Diagnosis   . Left bundle branch block (LBBB)   . Anxiety   . Cervical disc disorder   . Claustrophobia   . Hemangioma of liver   . Hyperlipidemia LDL goal <70   . Other specified hypothyroidism   . Nausea   . Neck pain on left side   . Vitamin D deficiency   . Abnormal glucose   . Cardiomyopathy   . Collagenous colitis   . Rib pain on right side   . Degenerative lumbar spinal stenosis   . Gastroesophageal reflux disease without esophagitis   . Left hip pain   . Localized osteoarthritis of left knee   . Nonobstructive atherosclerosis of coronary artery   . Spondylosis of lumbar spine   . Lumbosacral radiculopathy   . Tremor of both hands   . Other specified anemias   . Tinea pedis of right foot   . Chronic pain of left knee   . Other hemorrhoids   . Sebaceous cyst   . Essential (primary) hypertension   . COVID-19 virus IgG antibody test result unknown   . Weakness   . DDD (degenerative disc disease), lumbar   . Retrolisthesis of vertebrae             Current Meds:    Outpatient Medications Marked as Taking for the 03/04/20 encounter (Surgical Consult) with Kennis Carina, PA   Medication Sig Dispense Refill   . ALPRAZolam (Xanax XR) 0.5 MG 24  hr tablet Take 1 tablet (0.5 mg total) by mouth daily as needed (anxiety) 30 tablet 1   . atorvastatin (LIPITOR) 20 MG tablet Take 1 tablet (20 mg total) by mouth daily 90 tablet 1   . carvedilol (Coreg) 6.25 MG tablet Take 1 tablet (6.25 mg total) by mouth 2 (two) times daily 180 tablet 3   . cetirizine (ZyrTEC Allergy) 10 MG tablet Take 1 tablet (10 mg total) by mouth daily 30 tablet 11   . Cholecalciferol (VITAMIN D) 2000 UNITS Cap Take 5,000 Unit by mouth daily        . Coenzyme Q10 (CO Q 10) 10 MG Cap Take by mouth daily        . ezetimibe (ZETIA) 10 MG tablet Take 1 tablet (10 mg total) by mouth daily 90 tablet 1   . gabapentin (NEURONTIN) 300 MG capsule Take 300 mg by mouth 3 (three) times  daily as needed     . hydrocortisone 2.5 % cream PLACE RECTALLY TWICE A DAY 28 g 0   . Levoxyl 125 MCG tablet TAKE ONE TABLET BY MOUTH DAILY AT 6AM 90 tablet 1   . lidocaine (Lidoderm) 5 % Apply 1 patch for up to 12 hours a day, Remove & Discard patch within 12 hours 30 patch 3   . loperamide (IMODIUM) 2 MG capsule Take 2 mg by mouth as needed for Diarrhea        . olmesartan (BENICAR) 20 MG tablet Take 1 tablet (20 mg total) by mouth daily 4 days a week. 90 tablet 3   . olmesartan-hydrochlorothiazide (BENICAR HCT) 20-12.5 MG per tablet Take 1 tablet by mouth daily 3 days a week. 90 tablet 3   . ondansetron (ZOFRAN) 4 MG tablet Take 1 tablet (4 mg total) by mouth every 8 (eight) hours as needed for Nausea 30 tablet 1   . testosterone (ANDROGEL) 50 MG/5GM (1%) Gel Place 5 g onto the skin daily 1 pack, 4 day /week       . [DISCONTINUED] lidocaine (Lidoderm) 5 % Apply 1 patch for up to 12 hours a day, Remove & Discard patch within 12 hours (Patient taking differently: as needed Apply 1 patch for up to 12 hours a day, Remove & Discard patch within 12 hours  ) 30 patch 3   . [DISCONTINUED] lidocaine (Lidoderm) 5 % Apply 1 patch for up to 12 hours a day, Remove & Discard patch within 12 hours 30 patch 3          Allergies:    No  Known Allergies          Past Surgical History:    Past Surgical History:   Procedure Laterality Date   . CARDIAC CATHETERIZATION  09/2010   . COLONOSCOPY  06/21/2017   . EGD  10/31/2018    Dr. Rosalyn Charters- erythema   . TONSILLECTOMY  05/07/1944           Family History:    Family History   Problem Relation Age of Onset   . Migraines Mother    . Stomach cancer Sister         Died age 21   . Thyroid disease Sister 94   . Colon cancer Other         Cousin   . Diabetes Father            Social History:    Social History     Tobacco Use   . Smoking status: Former Smoker     Packs/day: 1.00   . Smokeless tobacco: Never Used   . Tobacco comment: quit 40 years ago    Vaping Use   . Vaping Use: Never used   Substance Use Topics   . Alcohol use: Never   . Drug use: Never      Social History     Social History Narrative   . Not on file           The following sections were reviewed this encounter by the provider:            Vital Signs:    Vitals:    03/04/20 1122   BP: 120/78   BP Site: Right arm   Patient Position: Sitting   Cuff Size: Large   Pulse: 80   Temp: 98 F (36.7 C)   TempSrc: Temporal   SpO2: 98%   Weight: 76.2 kg (  168 lb)   Height: 1.727 m (5\' 8" )           ROS:    Review of Systems   Constitutional: Negative for activity change and fever.   HENT: Negative for congestion, sinus pain and sore throat.    Respiratory: Negative for cough and shortness of breath.    Cardiovascular: Negative for chest pain.   Gastrointestinal: Negative for abdominal pain.   Skin: Negative for rash.   Neurological: Negative for dizziness.   Psychiatric/Behavioral: Negative for dysphoric mood.            Physical Exam:    Physical Exam  Constitutional:       Appearance: Normal appearance.   HENT:      Head: Normocephalic.      Right Ear: Tympanic membrane, ear canal and external ear normal.      Left Ear: Tympanic membrane, ear canal and external ear normal.      Nose: Nose normal.      Mouth/Throat:      Mouth: Mucous membranes are  moist.      Pharynx: Oropharynx is clear.   Eyes:      Conjunctiva/sclera: Conjunctivae normal.      Pupils: Pupils are equal, round, and reactive to light.   Cardiovascular:      Rate and Rhythm: Normal rate and regular rhythm.      Heart sounds: Normal heart sounds.   Pulmonary:      Effort: Pulmonary effort is normal.      Breath sounds: Normal breath sounds.   Abdominal:      General: Abdomen is flat. Bowel sounds are normal.      Palpations: Abdomen is soft.   Musculoskeletal:      Cervical back: Normal range of motion.      Right lower leg: No edema.      Left lower leg: No edema.   Skin:     General: Skin is warm and dry.   Neurological:      Mental Status: He is alert and oriented to person, place, and time.   Psychiatric:         Mood and Affect: Mood normal.         Behavior: Behavior normal.              Assessment/ Plan:    1. Pre-op evaluation  - CBC and differential  - Comprehensive metabolic panel  - PT/APTT  - Presurgical Surveillance, MSSA+MRSA  - X-ray chest PA and lateral; Future  - X-ray chest PA and lateral  - MRSA culture    2. Chronic pain of left knee  - lidocaine (Lidoderm) 5 %; Apply 1 patch for up to 12 hours a day, Remove & Discard patch within 12 hours  Dispense: 30 patch; Refill: 3    3. Hyperlipidemia LDL goal <70  - Lipid panel; Future    4. Vitamin D deficiency  - Vitamin D,25 OH, Total    No problems updated.    Cleared for surgery pending CXR and lab orders.         Follow-up:    Return for cholesterol follow up.         Kennis Carina, PA

## 2020-03-04 NOTE — Progress Notes (Addendum)
[facility]   Family Medicine                       Date of Exam: 03/04/2020 12:32 PM        Patient ID: Wesley [name] is a 79 y.o. male.  Attending Physician: Shamila Lerch L Yesica Kemler, PA        Chief Complaint:    Chief Complaint   Patient presents with   . Pre-op Exam               HPI:    79 y.o. M presents today for pre-op for Lumbar L4-L5, L5-S1 foraminotomy on 03/17/2020.        Visit Type: Pre-operative Evaluation  Surgery/Procedure: Lumbar L4-L5, L5-S1 foraminotomy  Indication for Surgery: back pain   Date of Surgery: 03/17/2020  Surgeon: Dr. Joseph Watson  Fax Number: [phone]  Surgical Risk Factors: hypertension  Prior Anesthesia: Patient reports no adverse reaction to anesthesia in the past.           Problem List:    Patient Active Problem List   Diagnosis   . Left bundle branch block (LBBB)   . Anxiety   . Cervical disc disorder   . Claustrophobia   . Hemangioma of liver   . Hyperlipidemia LDL goal <70   . Other specified hypothyroidism   . Nausea   . Neck pain on left side   . Vitamin D deficiency   . Abnormal glucose   . Cardiomyopathy   . Collagenous colitis   . Rib pain on right side   . Degenerative lumbar spinal stenosis   . Gastroesophageal reflux disease without esophagitis   . Left hip pain   . Localized osteoarthritis of left knee   . Nonobstructive atherosclerosis of coronary artery   . Spondylosis of lumbar spine   . Lumbosacral radiculopathy   . Tremor of both hands   . Other specified anemias   . Tinea pedis of right foot   . Chronic pain of left knee   . Other hemorrhoids   . Sebaceous cyst   . Essential (primary) hypertension   . COVID-19 virus IgG antibody test result unknown   . Weakness   . DDD (degenerative disc disease), lumbar   . Retrolisthesis of vertebrae             Current Meds:    Outpatient Medications Marked as Taking for the 03/04/20 encounter (Surgical Consult) with Kathyjo Briere L, PA   Medication Sig Dispense Refill   . ALPRAZolam (Xanax XR) 0.5 MG 24  hr tablet Take 1 tablet (0.5 mg total) by mouth daily as needed (anxiety) 30 tablet 1   . atorvastatin (LIPITOR) 20 MG tablet Take 1 tablet (20 mg total) by mouth daily 90 tablet 1   . carvedilol (Coreg) 6.25 MG tablet Take 1 tablet (6.25 mg total) by mouth 2 (two) times daily 180 tablet 3   . cetirizine (ZyrTEC Allergy) 10 MG tablet Take 1 tablet (10 mg total) by mouth daily 30 tablet 11   . Cholecalciferol (VITAMIN D) 2000 UNITS Cap Take 5,000 Unit by mouth daily        . Coenzyme Q10 (CO Q 10) 10 MG Cap Take by mouth daily        . ezetimibe (ZETIA) 10 MG tablet Take 1 tablet (10 mg total) by mouth daily 90 tablet 1   . gabapentin (NEURONTIN) 300 MG capsule Take 300 mg by mouth 3 (three) times   daily as needed     . hydrocortisone 2.5 % cream PLACE RECTALLY TWICE A DAY 28 g 0   . Levoxyl 125 MCG tablet TAKE ONE TABLET BY MOUTH DAILY AT 6AM 90 tablet 1   . lidocaine (Lidoderm) 5 % Apply 1 patch for up to 12 hours a day, Remove & Discard patch within 12 hours 30 patch 3   . loperamide (IMODIUM) 2 MG capsule Take 2 mg by mouth as needed for Diarrhea        . olmesartan (BENICAR) 20 MG tablet Take 1 tablet (20 mg total) by mouth daily 4 days a week. 90 tablet 3   . olmesartan-hydrochlorothiazide (BENICAR HCT) 20-12.5 MG per tablet Take 1 tablet by mouth daily 3 days a week. 90 tablet 3   . ondansetron (ZOFRAN) 4 MG tablet Take 1 tablet (4 mg total) by mouth every 8 (eight) hours as needed for Nausea 30 tablet 1   . testosterone (ANDROGEL) 50 MG/5GM (1%) Gel Place 5 g onto the skin daily 1 pack, 4 day /week       . [DISCONTINUED] lidocaine (Lidoderm) 5 % Apply 1 patch for up to 12 hours a day, Remove & Discard patch within 12 hours (Patient taking differently: as needed Apply 1 patch for up to 12 hours a day, Remove & Discard patch within 12 hours  ) 30 patch 3   . [DISCONTINUED] lidocaine (Lidoderm) 5 % Apply 1 patch for up to 12 hours a day, Remove & Discard patch within 12 hours 30 patch 3          Allergies:    No  Known Allergies          Past Surgical History:    Past Surgical History:   Procedure Laterality Date   . CARDIAC CATHETERIZATION  09/2010   . COLONOSCOPY  06/21/2017   . EGD  10/31/2018    Dr. Minal Patel- erythema   . TONSILLECTOMY  05/07/1944           Family History:    Family History   Problem Relation Age of Onset   . Migraines Mother    . Stomach cancer Sister         Died age 80   . Thyroid disease Sister 17   . Colon cancer Other         Cousin   . Diabetes Father            Social History:    Social History     Tobacco Use   . Smoking status: Former Smoker     Packs/day: 1.00   . Smokeless tobacco: Never Used   . Tobacco comment: quit 40 years ago    Vaping Use   . Vaping Use: Never used   Substance Use Topics   . Alcohol use: Never   . Drug use: Never      Social History     Social History Narrative   . Not on file           The following sections were reviewed this encounter by the provider:            Vital Signs:    Vitals:    03/04/20 1122   BP: 120/78   BP Site: Right arm   Patient Position: Sitting   Cuff Size: Large   Pulse: 80   Temp: 98 F (36.7 C)   TempSrc: Temporal   SpO2: 98%   Weight: 76.2 kg (  168 lb)   Height: 1.727 m (5' 8")           ROS:    Review of Systems   Constitutional: Negative for activity change and fever.   HENT: Negative for congestion, sinus pain and sore throat.    Respiratory: Negative for cough and shortness of breath.    Cardiovascular: Negative for chest pain.   Gastrointestinal: Negative for abdominal pain.   Skin: Negative for rash.   Neurological: Negative for dizziness.   Psychiatric/Behavioral: Negative for dysphoric mood.            Physical Exam:    Physical Exam  Constitutional:       Appearance: Normal appearance.   HENT:      Head: Normocephalic.      Right Ear: Tympanic membrane, ear canal and external ear normal.      Left Ear: Tympanic membrane, ear canal and external ear normal.      Nose: Nose normal.      Mouth/Throat:      Mouth: Mucous membranes are  moist.      Pharynx: Oropharynx is clear.   Eyes:      Conjunctiva/sclera: Conjunctivae normal.      Pupils: Pupils are equal, round, and reactive to light.   Cardiovascular:      Rate and Rhythm: Normal rate and regular rhythm.      Heart sounds: Normal heart sounds.   Pulmonary:      Effort: Pulmonary effort is normal.      Breath sounds: Normal breath sounds.   Abdominal:      General: Abdomen is flat. Bowel sounds are normal.      Palpations: Abdomen is soft.   Musculoskeletal:      Cervical back: Normal range of motion.      Right lower leg: No edema.      Left lower leg: No edema.   Skin:     General: Skin is warm and dry.   Neurological:      Mental Status: He is alert and oriented to person, place, and time.   Psychiatric:         Mood and Affect: Mood normal.         Behavior: Behavior normal.              Assessment/ Plan:    1. Pre-op evaluation  - CBC and differential  - Comprehensive metabolic panel  - PT/APTT  - Presurgical Surveillance, MSSA+MRSA  - X-ray chest PA and lateral; Future  - X-ray chest PA and lateral  - MRSA culture    2. Chronic pain of left knee  - lidocaine (Lidoderm) 5 %; Apply 1 patch for up to 12 hours a day, Remove & Discard patch within 12 hours  Dispense: 30 patch; Refill: 3    3. Hyperlipidemia LDL goal <70  - Lipid panel; Future    4. Vitamin D deficiency  - Vitamin D,25 OH, Total    No problems updated.    Cleared for surgery pending CXR and lab orders.         Follow-up:    Return for cholesterol follow up.         Xiamara Hulet L Littleton Haub, PA

## 2020-03-04 NOTE — Patient Instructions (Signed)
1 month follow up

## 2020-03-04 NOTE — Addendum Note (Signed)
Addended by: Mechele Collin on: 03/04/2020 12:19 PM     Modules accepted: Orders

## 2020-03-08 ENCOUNTER — Ambulatory Visit: Payer: Medicare Other | Attending: Neurological Surgery

## 2020-03-08 ENCOUNTER — Other Ambulatory Visit: Payer: Self-pay | Admitting: Internal Medicine

## 2020-03-08 DIAGNOSIS — M47816 Spondylosis without myelopathy or radiculopathy, lumbar region: Secondary | ICD-10-CM | POA: Insufficient documentation

## 2020-03-08 DIAGNOSIS — Z01812 Encounter for preprocedural laboratory examination: Secondary | ICD-10-CM | POA: Insufficient documentation

## 2020-03-08 DIAGNOSIS — Z01818 Encounter for other preprocedural examination: Secondary | ICD-10-CM

## 2020-03-08 LAB — URINALYSIS REFLEX TO MICROSCOPIC EXAM - REFLEX TO CULTURE
Bilirubin, UA: NEGATIVE
Blood, UA: NEGATIVE
Glucose, UA: NEGATIVE
Ketones UA: NEGATIVE
Leukocyte Esterase, UA: NEGATIVE
Nitrite, UA: NEGATIVE
Protein, UR: NEGATIVE
Specific Gravity UA: 1.011 (ref 1.001–1.035)
Urine pH: 7 (ref 5.0–8.0)
Urobilinogen, UA: NORMAL mg/dL (ref 0.2–2.0)

## 2020-03-08 LAB — CBC AND DIFFERENTIAL
Baso(Absolute): 0.1 10*3/uL (ref 0.0–0.2)
Basos: 1 %
Eos: 4 %
Eosinophils Absolute: 0.2 10*3/uL (ref 0.0–0.4)
Hematocrit: 38 % (ref 37.5–51.0)
Hemoglobin: 12.9 g/dL — ABNORMAL LOW (ref 13.0–17.7)
Immature Granulocytes Absolute: 0 10*3/uL (ref 0.0–0.1)
Immature Granulocytes: 0 %
Lymphocytes Absolute: 1.3 10*3/uL (ref 0.7–3.1)
Lymphocytes: 21 %
MCH: 27.6 pg (ref 26.6–33.0)
MCHC: 33.9 g/dL (ref 31.5–35.7)
MCV: 81 fL (ref 79–97)
Monocytes Absolute: 0.9 10*3/uL (ref 0.1–0.9)
Monocytes: 14 %
Neutrophils Absolute: 3.6 10*3/uL (ref 1.4–7.0)
Neutrophils: 60 %
Platelets: 252 10*3/uL (ref 150–450)
RBC: 4.67 x10E6/uL (ref 4.14–5.80)
RDW: 14.5 % (ref 11.6–15.4)
WBC: 6 10*3/uL (ref 3.4–10.8)

## 2020-03-08 LAB — COMPREHENSIVE METABOLIC PANEL
ALT: 32 IU/L (ref 0–44)
AST (SGOT): 31 IU/L (ref 0–40)
African American eGFR: 101 mL/min/{1.73_m2} (ref >59)
Albumin/Globulin Ratio: 2.3 — ABNORMAL HIGH (ref 1.2–2.2)
Albumin: 5 g/dL — ABNORMAL HIGH (ref 3.7–4.7)
Alkaline Phosphatase: 76 IU/L (ref 44–121)
BUN / Creatinine Ratio: 17 (ref 10–24)
BUN: 13 mg/dL (ref 8–27)
Bilirubin, Total: 0.4 mg/dL (ref 0.0–1.2)
CO2: 26 mmol/L (ref 20–29)
Calcium: 9.7 mg/dL (ref 8.6–10.2)
Chloride: 97 mmol/L (ref 96–106)
Creatinine: 0.76 mg/dL (ref 0.76–1.27)
Globulin, Total: 2.2 g/dL (ref 1.5–4.5)
Glucose: 83 mg/dL (ref 65–99)
Potassium: 4.3 mmol/L (ref 3.5–5.2)
Protein, Total: 7.2 g/dL (ref 6.0–8.5)
Sodium: 140 mmol/L (ref 134–144)
non-African American eGFR: 87 mL/min/{1.73_m2} (ref >59)

## 2020-03-08 LAB — REFLEX - MSSA+MRSA

## 2020-03-08 LAB — PT AND APTT
PT INR: 1.1 (ref 0.9–1.2)
PT: 11.1 s (ref 9.1–12.0)
aPTT: 29 s (ref 24–33)

## 2020-03-08 LAB — PRESURGICAL SURVEILLANCE, MSSA+MRSA

## 2020-03-08 LAB — MRSA CULTURE: MRSA Screening Culture: NEGATIVE

## 2020-03-08 NOTE — Progress Notes (Signed)
Hi Mr. Bearse,     Need CXR for clearance.      The recent lab results show the following:   CBC- no anemia or sign of infection.  Normal CMP- stable kidney and liver function. Fasting sugar is normal- no sign of diabetes or pre diabetes. Normal electrolytes.  PT/PTT and MRSA were negative.     Ashleah Valtierra PA-C

## 2020-03-08 NOTE — Progress Notes (Signed)
Hi Mr. Krapf,     The recent lab results show the following:   CBC- no anemia or sign of infection.  Normal CMP- stable kidney and liver function. Fasting sugar is normal- no sign of diabetes or pre diabetes. Normal electrolytes.  PT/PTT - normal.  MRSA screening negative.   Awaiting CXR for pre op clearance.     Koral Thaden PA-C

## 2020-03-09 ENCOUNTER — Encounter (INDEPENDENT_AMBULATORY_CARE_PROVIDER_SITE_OTHER): Payer: Self-pay | Admitting: Physician Assistant

## 2020-03-09 ENCOUNTER — Encounter (INDEPENDENT_AMBULATORY_CARE_PROVIDER_SITE_OTHER): Payer: Self-pay | Admitting: Family Medicine

## 2020-03-09 LAB — PRESURGICAL SURVEILLANCE, MSSA+MRSA

## 2020-03-09 LAB — THYROID STIMULATING HORMONE (TSH), REFLEX ON ABNORMAL TO FREE T4, SERUM: TSH, Abn Reflex to Free T4, Serum: 2.93 u[IU]/mL (ref 0.35–4.94)

## 2020-03-09 NOTE — Progress Notes (Signed)
Called [facility] at [phone].  Spoke with receptionist, Florentina Addison.  Results refaxed to our office at back fax number.

## 2020-03-09 NOTE — Progress Notes (Signed)
Please reach out to [facility] fr CXR results. 11/1. Not yet received.    Please also ask them to remove any alternative offices for me from their system.

## 2020-03-09 NOTE — Progress Notes (Signed)
Hi Mr. Bushey,     The recent lab results show the following:   MRSA is negative.     Still waiting for CXR results.    Sarvesh Meddaugh PA-C

## 2020-03-17 ENCOUNTER — Encounter
Admission: RE | Disposition: A | Discharge status: Home / Self Care | Payer: Self-pay | Source: Ambulatory Visit | Attending: Neurological Surgery

## 2020-03-17 ENCOUNTER — Ambulatory Visit: Payer: Medicare Other

## 2020-03-17 ENCOUNTER — Observation Stay
Admission: RE | Admit: 2020-03-17 | Discharge: 2020-03-17 | Disposition: A | Discharge status: Home / Self Care | Payer: Medicare Other | Source: Ambulatory Visit | Attending: Neurological Surgery | Admitting: Neurological Surgery

## 2020-03-17 ENCOUNTER — Observation Stay: Payer: Medicare Other | Admitting: Anesthesiology

## 2020-03-17 DIAGNOSIS — M5116 Intervertebral disc disorders with radiculopathy, lumbar region: Secondary | ICD-10-CM

## 2020-03-17 DIAGNOSIS — M48061 Spinal stenosis, lumbar region without neurogenic claudication: Principal | ICD-10-CM | POA: Insufficient documentation

## 2020-03-17 DIAGNOSIS — E785 Hyperlipidemia, unspecified: Secondary | ICD-10-CM | POA: Insufficient documentation

## 2020-03-17 DIAGNOSIS — M4807 Spinal stenosis, lumbosacral region: Secondary | ICD-10-CM | POA: Insufficient documentation

## 2020-03-17 DIAGNOSIS — E038 Other specified hypothyroidism: Secondary | ICD-10-CM | POA: Insufficient documentation

## 2020-03-17 DIAGNOSIS — I429 Cardiomyopathy, unspecified: Secondary | ICD-10-CM | POA: Insufficient documentation

## 2020-03-17 HISTORY — DX: Spondylosis without myelopathy or radiculopathy, lumbar region: M47.816

## 2020-03-17 HISTORY — PX: LAMINECTOMY, POSTERIOR LUMBAR, DECOMPRESSION, LEVEL 2: SHX4462

## 2020-03-17 HISTORY — DX: Low back pain, unspecified: M54.50

## 2020-03-17 HISTORY — DX: Claustrophobia: F40.240

## 2020-03-17 LAB — GLUCOSE WHOLE BLOOD - POCT: Whole Blood Glucose POCT: 115 mg/dL — ABNORMAL HIGH (ref 70–100)

## 2020-03-17 SURGERY — LAMINECTOMY, POSTERIOR LUMBAR, DECOMPRESSION, LEVEL 2
Anesthesia: Anesthesia General | Laterality: Right | Wound class: Clean

## 2020-03-17 MED ORDER — ROCURONIUM BROMIDE 50 MG/5ML IV SOLN
INTRAVENOUS | Status: AC
Start: 2020-03-17 — End: ?
  Filled 2020-03-17: qty 5

## 2020-03-17 MED ORDER — DEXMEDETOMIDINE HCL IN NACL 200 MCG/50ML IV SOLN
INTRAVENOUS | Status: DC | PRN
Start: 2020-03-17 — End: 2020-03-17
  Administered 2020-03-17: .1 ug/kg/h via INTRAVENOUS

## 2020-03-17 MED ORDER — NALOXONE HCL 4 MG/0.1ML NA LIQD
NASAL | 0 refills | Status: DC
Start: 2020-03-17 — End: 2020-04-25

## 2020-03-17 MED ORDER — ONDANSETRON HCL 4 MG/2ML IJ SOLN
INTRAMUSCULAR | Status: AC
Start: 2020-03-17 — End: ?
  Filled 2020-03-17: qty 2

## 2020-03-17 MED ORDER — FENTANYL CITRATE (PF) 50 MCG/ML IJ SOLN (WRAP)
INTRAMUSCULAR | Status: AC
Start: 2020-03-17 — End: ?
  Filled 2020-03-17: qty 2

## 2020-03-17 MED ORDER — PROPOFOL INFUSION 10 MG/ML
INTRAVENOUS | Status: DC | PRN
Start: 2020-03-17 — End: 2020-03-17
  Administered 2020-03-17: 120 mg via INTRAVENOUS
  Administered 2020-03-17: 50 mg via INTRAVENOUS

## 2020-03-17 MED ORDER — FENTANYL CITRATE (PF) 50 MCG/ML IJ SOLN (WRAP)
INTRAMUSCULAR | Status: DC | PRN
Start: 2020-03-17 — End: 2020-03-17
  Administered 2020-03-17: 25 ug via INTRAVENOUS
  Administered 2020-03-17: 50 ug via INTRAVENOUS
  Administered 2020-03-17: 100 ug via INTRAVENOUS

## 2020-03-17 MED ORDER — PHENYLEPHRINE HCL 10 MG/ML IV SOLN (WRAP)
Status: DC | PRN
Start: 2020-03-17 — End: 2020-03-17
  Administered 2020-03-17: 100 ug via INTRAVENOUS
  Administered 2020-03-17: 50 ug via INTRAVENOUS

## 2020-03-17 MED ORDER — LIDOCAINE HCL (PF) 1 % IJ SOLN
INTRAMUSCULAR | Status: DC | PRN
Start: 2020-03-17 — End: 2020-03-17
  Administered 2020-03-17: 60 mg via INTRAVENOUS

## 2020-03-17 MED ORDER — CYCLOBENZAPRINE HCL 10 MG PO TABS
5.0000 mg | ORAL_TABLET | Freq: Three times a day (TID) | ORAL | 0 refills | Status: DC | PRN
Start: 2020-03-17 — End: 2020-04-18

## 2020-03-17 MED ORDER — CEFAZOLIN SODIUM-DEXTROSE 2-3 GM-%(50ML) IV SOLR
2.0000 g | INTRAVENOUS | Status: AC
Start: 2020-03-17 — End: 2020-03-17
  Administered 2020-03-17: 10:00:00 2 g via INTRAVENOUS

## 2020-03-17 MED ORDER — MICROFIBRILLAR COLL HEMOSTAT EX POWD
CUTANEOUS | Status: DC | PRN
Start: 2020-03-17 — End: 2020-03-17
  Administered 2020-03-17: 1 g via TOPICAL

## 2020-03-17 MED ORDER — VANCOMYCIN HCL 1 G IV SOLR
INTRAVENOUS | Status: AC
Start: 2020-03-17 — End: ?
  Filled 2020-03-17: qty 1000

## 2020-03-17 MED ORDER — FAMOTIDINE 20 MG/2ML IV SOLN
INTRAVENOUS | Status: AC
Start: 2020-03-17 — End: 2020-03-17
  Administered 2020-03-17: 09:00:00 20 mg via INTRAVENOUS
  Filled 2020-03-17: qty 2

## 2020-03-17 MED ORDER — CEFAZOLIN SODIUM 1 G IJ SOLR
INTRAMUSCULAR | Status: AC
Start: 2020-03-17 — End: ?
  Filled 2020-03-17: qty 2000

## 2020-03-17 MED ORDER — OXYCODONE HCL 5 MG PO TABS
5.0000 mg | ORAL_TABLET | Freq: Once | ORAL | Status: DC | PRN
Start: 2020-03-17 — End: 2020-03-17

## 2020-03-17 MED ORDER — SODIUM CHLORIDE 0.9 % IV SOLN
INTRAVENOUS | Status: DC | PRN
Start: 2020-03-17 — End: 2020-03-17
  Administered 2020-03-17: 10:00:00 50 ug via INTRAVENOUS
  Administered 2020-03-17: 10:00:00 20 ug/min via INTRAVENOUS
  Administered 2020-03-17: 10:00:00 100 ug via INTRAVENOUS

## 2020-03-17 MED ORDER — SODIUM CHLORIDE (PF) 0.9 % IJ SOLN
INTRAMUSCULAR | Status: AC
Start: 2020-03-17 — End: ?
  Filled 2020-03-17: qty 10

## 2020-03-17 MED ORDER — OXYCODONE-ACETAMINOPHEN 5-325 MG PO TABS
1.0000 | ORAL_TABLET | ORAL | 0 refills | Status: AC | PRN
Start: 2020-03-17 — End: 2020-03-24

## 2020-03-17 MED ORDER — ACETAMINOPHEN 500 MG PO TABS
1000.0000 mg | ORAL_TABLET | Freq: Once | ORAL | Status: AC
Start: 2020-03-17 — End: 2020-03-17

## 2020-03-17 MED ORDER — VANCOMYCIN HCL 1 G IV SOLR
INTRAVENOUS | Status: DC | PRN
Start: 2020-03-17 — End: 2020-03-17
  Administered 2020-03-17: 1000 mg

## 2020-03-17 MED ORDER — ONDANSETRON HCL 4 MG/2ML IJ SOLN
INTRAMUSCULAR | Status: DC | PRN
Start: 2020-03-17 — End: 2020-03-17
  Administered 2020-03-17: 4 mg via INTRAVENOUS

## 2020-03-17 MED ORDER — LIDOCAINE-EPINEPHRINE 1 %-1:100000 IJ SOLN
INTRAMUSCULAR | Status: DC | PRN
Start: 2020-03-17 — End: 2020-03-17
  Administered 2020-03-17: 4 mL

## 2020-03-17 MED ORDER — GLYCOPYRROLATE 0.2 MG/ML IJ SOLN (WRAP)
INTRAMUSCULAR | Status: AC
Start: 2020-03-17 — End: ?
  Filled 2020-03-17: qty 1

## 2020-03-17 MED ORDER — BACITRACIN ZINC 500 UNIT/GM EX OINT
TOPICAL_OINTMENT | CUTANEOUS | Status: AC
Start: 2020-03-17 — End: ?
  Filled 2020-03-17: qty 15

## 2020-03-17 MED ORDER — EPHEDRINE SULFATE 50 MG/ML IJ/IV SOLN (WRAP)
Status: AC
Start: 2020-03-17 — End: ?
  Filled 2020-03-17: qty 1

## 2020-03-17 MED ORDER — AMMONIA AROMATIC IN INHA
1.0000 | Freq: Once | RESPIRATORY_TRACT | Status: DC | PRN
Start: 2020-03-17 — End: 2020-03-17

## 2020-03-17 MED ORDER — HYDROMORPHONE HCL 1 MG/ML IJ SOLN
INTRAMUSCULAR | Status: AC
Start: 2020-03-17 — End: ?
  Filled 2020-03-17: qty 1

## 2020-03-17 MED ORDER — THROMBIN 5000 UNITS EX SOLR
CUTANEOUS | Status: DC | PRN
Start: 2020-03-17 — End: 2020-03-17
  Administered 2020-03-17: 10000 [IU] via TOPICAL

## 2020-03-17 MED ORDER — BUPIVACAINE HCL (PF) 0.25 % IJ SOLN
INTRAMUSCULAR | Status: DC | PRN
Start: 2020-03-17 — End: 2020-03-17
  Administered 2020-03-17: 4 mL

## 2020-03-17 MED ORDER — ONDANSETRON HCL 4 MG/2ML IJ SOLN
4.0000 mg | Freq: Once | INTRAMUSCULAR | Status: DC | PRN
Start: 2020-03-17 — End: 2020-03-17

## 2020-03-17 MED ORDER — METOCLOPRAMIDE HCL 5 MG/ML IJ SOLN
10.0000 mg | Freq: Once | INTRAMUSCULAR | Status: DC | PRN
Start: 2020-03-17 — End: 2020-03-17

## 2020-03-17 MED ORDER — PHENYLEPHRINE HCL-NACL 100-0.9 MG/250ML-% IV SOLN
INTRAVENOUS | Status: AC
Start: 2020-03-17 — End: ?
  Filled 2020-03-17: qty 250

## 2020-03-17 MED ORDER — PROPOFOL 10 MG/ML IV EMUL (WRAP)
INTRAVENOUS | Status: AC
Start: 2020-03-17 — End: ?
  Filled 2020-03-17: qty 20

## 2020-03-17 MED ORDER — FAMOTIDINE 10 MG/ML IV SOLN (WRAP)
20.0000 mg | Freq: Once | INTRAVENOUS | Status: AC
Start: 2020-03-17 — End: 2020-03-17

## 2020-03-17 MED ORDER — PHENYLEPHRINE 100 MCG/ML IV SOSY (WRAP)
PREFILLED_SYRINGE | INTRAVENOUS | Status: AC
Start: 2020-03-17 — End: ?
  Filled 2020-03-17: qty 10

## 2020-03-17 MED ORDER — BUPIVACAINE LIPOSOME 1.3 % IJ SUSP
INTRAMUSCULAR | Status: AC
Start: 2020-03-17 — End: ?
  Filled 2020-03-17: qty 10

## 2020-03-17 MED ORDER — FENTANYL CITRATE (PF) 50 MCG/ML IJ SOLN (WRAP)
25.0000 ug | INTRAMUSCULAR | Status: DC | PRN
Start: 2020-03-17 — End: 2020-03-17

## 2020-03-17 MED ORDER — THROMBIN 5000 UNITS EX SOLR
CUTANEOUS | Status: AC
Start: 2020-03-17 — End: ?
  Filled 2020-03-17: qty 15000

## 2020-03-17 MED ORDER — LACTATED RINGERS IV SOLN
INTRAVENOUS | Status: DC
Start: 2020-03-17 — End: 2020-03-17

## 2020-03-17 MED ORDER — GELATIN ABSORBABLE 100 EX MISC
CUTANEOUS | Status: DC | PRN
Start: 2020-03-17 — End: 2020-03-17
  Administered 2020-03-17: 1 via TOPICAL

## 2020-03-17 MED ORDER — BUPIVACAINE LIPOSOME 1.3 % IJ SUSP
INTRAMUSCULAR | Status: DC | PRN
Start: 2020-03-17 — End: 2020-03-17
  Administered 2020-03-17: 3 mL

## 2020-03-17 MED ORDER — ROCURONIUM BROMIDE 10 MG/ML IV SOLN (WRAP)
INTRAVENOUS | Status: DC | PRN
Start: 2020-03-17 — End: 2020-03-17
  Administered 2020-03-17: 50 mg via INTRAVENOUS

## 2020-03-17 MED ORDER — HYDROMORPHONE HCL 0.5 MG/0.5 ML IJ SOLN
0.5000 mg | INTRAMUSCULAR | Status: DC | PRN
Start: 2020-03-17 — End: 2020-03-17

## 2020-03-17 MED ORDER — LIDOCAINE-EPINEPHRINE 1 %-1:100000 IJ SOLN
INTRAMUSCULAR | Status: AC
Start: 2020-03-17 — End: ?
  Filled 2020-03-17: qty 20

## 2020-03-17 MED ORDER — BUPIVACAINE HCL (PF) 0.25 % IJ SOLN
INTRAMUSCULAR | Status: AC
Start: 2020-03-17 — End: ?
  Filled 2020-03-17: qty 20

## 2020-03-17 MED ORDER — ACETAMINOPHEN 500 MG PO TABS
ORAL_TABLET | ORAL | Status: AC
Start: 2020-03-17 — End: 2020-03-17
  Administered 2020-03-17: 09:00:00 1000 mg via ORAL
  Filled 2020-03-17: qty 2

## 2020-03-17 MED ORDER — LIDOCAINE HCL (PF) 2 % IJ SOLN
INTRAMUSCULAR | Status: AC
Start: 2020-03-17 — End: ?
  Filled 2020-03-17: qty 5

## 2020-03-17 MED ORDER — FAMOTIDINE 20 MG/2ML IV SOLN
INTRAVENOUS | Status: AC
Start: 2020-03-17 — End: ?
  Filled 2020-03-17: qty 2

## 2020-03-17 SURGICAL SUPPLY — 101 items
ADHESIVE SKIN CLOSURE DERMABOND MINI .36 (Suture)
ADHESIVE SKIN CLOSURE DERMABOND MINI .36 ML LIQUID APPLICATOR (Suture) IMPLANT
ADHESIVE SKNCLS 2 OCTYL CYNCRLT .36ML MN (Suture)
AGENT HEMOSTATIC PRELOAD APPLCTR SHT 1MM ENDOAVITENE 50X15MM 42CM 10MM (Hemostat) ×1 IMPLANT
AGENT HEMOSTATIC PRELOAD APPLICATOR (Hemostat) ×1
AGENT HMST CLGN 1MM ENDAVTN 10MM 50X15MM (Hemostat) ×1
ANGIO CATH CODM (Procedure Accessories) ×4 IMPLANT
BANDAGE ADH PLSTR POLYACRYLATE CVRL 2YD (Dressing)
BANDAGE ADHESIVE L2 YD X W6 IN STRETCH (Dressing)
BANDAGE ADHESIVE L2 YD X W6 IN STRETCH NONWOVEN POROUS COVER-ROLL (Dressing) IMPLANT
CATHETER FOLEY KT 16FR (Catheter Micellaneous)
CATHETER FOLEY KT 16FR (Catheter Miscellaneous) IMPLANT
CONTAINER SPEC 8OZ NS SNPON LID TRNLU (Suction) ×1 IMPLANT
DRAPE INST MAG 121 (Drape) ×4 IMPLANT
DRAPE SRG PE STRDRP 17X11IN LF STRL ADH (Drape) ×2
DRAPE SRG TBRN CNVRT 122X106X77IN LF (Drape) ×4
DRAPE SRG TBRN LG CNVRT 98X72IN LF STRL (Drape) ×2
DRAPE SURGICAL ADHESIVE STRIP SMALL TOWEL MATTE FINISH L17 IN X W11 IN (Drape) ×1 IMPLANT
DRAPE SURGICAL FANFOLD L98 IN X W72 IN (Drape) ×2
DRAPE SURGICAL FANFOLD L98 IN X W72 IN CONVERTORS TIBURON LARGE (Drape) ×2 IMPLANT
DRAPE SURGICAL IMPERVIOUS REINFORCEMENT FENESTRATE ABSORBENT ARMBOARD (Drape) ×2 IMPLANT
ELECTRODE ADULT PATIENT RETURN L9 FT REM POLYHESIVE ACRYLIC FOAM (Procedure Accessories) ×1 IMPLANT
ELECTRODE PATIENT RETURN L9 FT VALLEYLAB (Procedure Accessories) ×1
ELECTRODE PT RTN RM PHSV ACRL FM C30- LB (Procedure Accessories) ×1
FORCEP BOVIE TWEEZER DISP (Instrument) ×4 IMPLANT
GLOVE SRG NTR RBR 8.5 BGL IND 301X109MM (Glove) ×1
GLOVE SRG PLISPRN 8.5 BGL PI ORTHOPRO (Glove) ×2
GLOVE SURGICAL 8 1/2 BIOGEL PI ORTHOPRO (Glove) ×2
GLOVE SURGICAL 8 1/2 BIOGEL PI ORTHOPRO POWDER FREE MICRO ROUGH (Glove) ×2 IMPLANT
GLOVE SURGICAL 8.5 BIOGEL INDICATOR (Glove) ×1
GLOVE SURGICAL 8.5 BIOGEL INDICATOR POWDER FREE BEAD CUFF SMOOTH (Glove) ×1 IMPLANT
GOWN SRG POLY 2XL ASTND LF STRL LVL 4 (Gown) ×2
GOWN SURGICAL 2XL POLY ASTOUND BLUE (Gown) ×2
GOWN SURGICAL 2XL POLY ASTOUND BLUE LEVEL 4 IMPERVIOUS REINFORCE SET (Gown) ×1 IMPLANT
KIT DRAINAGE L12.5 IN ROUND 3 SPRING (Drain)
KIT DRAINAGE L12.5 IN ROUND 3 SPRING EVACUATOR Y CONNECTOR TUBE HOLE (Drain) IMPLANT
KIT DRN PVC 400CC RND 1/8IN 12.5IN LF (Drain)
NEEDLE HPO SS PP THNWL BD 19GA 1.5IN LF (Needles) ×6 IMPLANT
NEEDLE SPINAL BD OD18 GA L3 1/2 IN (Needles)
NEEDLE SPINAL L3 1/2 IN REGULAR WALL QUINCKE TIP OD18 GA BD (Needles) IMPLANT
NEEDLE SPNL PP RW BD QNCK 18GA 3.5IN LF (Needles)
POSITIONER HEAD FOAM SLOTTED (Positioning Supplies) ×1
POSITIONER OR L8.5 IN X W8 IN X H4 IN (Positioning Supplies) ×1
POSITIONER OR L8.5 IN X W8 IN X H4 IN HIGH RESILIENT SLOT FOAM HEAD (Positioning Supplies) ×1 IMPLANT
SLEEVE CMPR MED KN LGTH KDL SCD 21- IN (Sleeve) ×2
SLEEVE COMPRESSION MEDIUM KNEE LENGTH KENDALL SEQUENTIAL OD21- IN (Sleeve) ×1 IMPLANT
SOL NACL INJ 0.9% 30ML BACTER (IV Solutions) ×1
SOLUTION IRR 0.9% NACL 1000ML LF STRL (Irrigation Solutions) ×1
SOLUTION IRRIGATION 0.9% SODIUM CHLORIDE (Irrigation Solutions) ×1
SOLUTION IRRIGATION 0.9% SODIUM CHLORIDE 1000 ML PLASTIC POUR BOTTLE (Irrigation Solutions) ×1 IMPLANT
SOLUTION IV 0.9% SODIUM CHLORIDE 30 ML (IV Solutions) ×1
SOLUTION IV 0.9% SODIUM CHLORIDE 30 ML BACTERIOSTATIC MULTIPLE DOSE (IV Solutions) ×1 IMPLANT
SOLUTION SRGPRP 74% ISPRP 0.7% IOD (Prep) ×1
SOLUTION SURGICAL PREP 26 ML DURAPREP (Prep) ×1
SOLUTION SURGICAL PREP 26 ML DURAPREP 74% ISOPROPYL ALCOHOL 0.7% (Prep) ×1 IMPLANT
SPNG ABSORBABLE GELATIN (Hemostat) ×1
SPONGE ABSORBABLE L12.5 CM X W8 CM (Hemostat) ×1
SPONGE ABSORBABLE L12.5 CM X W8 CM PORCINE GELATIN SURGIFOAM THK10 MM (Hemostat) ×1 IMPLANT
SPONGE SRG CTTND CDMN SUTUREWELD 3X1IN (Sponge) ×1
SPONGE SRG CTTND SUTUREWELD 3X.5IN LF (Sponge) ×1
SPONGE SRG VISTEC 8X4IN LF STRL 12 PLY (Sponge)
SPONGE SURGICAL L3 IN X W1 IN ABSORBENT (Sponge) ×1
SPONGE SURGICAL L3 IN X W1 IN ABSORBENT PRECISION CUT RADIOPAQUE (Sponge) ×1 IMPLANT
SPONGE SURGICAL L3 IN X W1/2 IN (Sponge) ×1
SPONGE SURGICAL L3 IN X W1/2 IN ABSORBENT PRECISION CUT RADIOPAQUE (Sponge) ×1 IMPLANT
SPONGE SURGICAL L8 IN X W4 IN 12 PLY (Sponge)
SPONGE SURGICAL L8 IN X W4 IN 12 PLY RADIOPAQUE BAND VISTEC BLUE WHITE (Sponge) IMPLANT
STAPLER SKIN L3.9 MM X W6.9 MM WIDE 35 (Skin Closure) ×1
STAPLER SKIN L3.9 MM X W6.9 MM WIDE 35 COUNT FIX HEAD RATCHET (Skin Closure) IMPLANT
STAPLER SKN SS W PRX PX .58MM 3.9X6.9MM (Skin Closure) ×1
SUTURE ABS 0 OS-6 VCL 18IN CR BRD 3 STRN (Suture) ×2
SUTURE ABS 2-0 CP-2 VCL 18IN CR BRD 8 (Suture) ×2
SUTURE ABS 3-0 SH VCL 18IN CR BRD 8 STRN (Suture) ×1
SUTURE ABS 4-0 FS2 VCL 27IN BRD COAT UD (Suture)
SUTURE COATED VICRYL 0 OS-6 L18 IN (Suture) ×2
SUTURE COATED VICRYL 0 OS-6 L18 IN CONTROL RELEASE BRAID 3 STRAND (Suture) ×2 IMPLANT
SUTURE COATED VICRYL 2-0 CP-2 L18 IN (Suture) ×2 IMPLANT
SUTURE COATED VICRYL 3-0 SH L18 IN (Suture) ×1
SUTURE COATED VICRYL 3-0 SH L18 IN CONTROL BRD 8 STRAND UNDYED ABSRBBL (Suture) ×1 IMPLANT
SUTURE COATED VICRYL 4-0 FS-2 L27 IN (Suture) IMPLANT
SUTURE NABSB 4-0 TF TAPERPOINT NRLN 18IN (Suture) ×1
SUTURE NABSB 6-0 C-1 PRLN 30IN MFL BLU (Suture)
SUTURE NUROLON BLACK 4-0 TF TAPERPOINT (Suture) ×1
SUTURE NUROLON BLCK 4-0 TF TAPERPOINT L18IN CNTRL BRD 8 STRND NNBSRBBL (Suture) ×1 IMPLANT
SUTURE PROLENE BLUE 6-0 C-1 L30 IN (Suture)
SUTURE PROLENE BLUE 6-0 C-1 L30 IN MONOFILAMENT NONABSORBABLE (Suture) IMPLANT
TBNG CON SCTN STRL .1875X10 (Tubing) ×2
TIP SCT PLS STD YNKR LF STRL TUBE FLXB (Suction)
TIP SUCTION MEDLINE STANDARD PLASTIC (Suction)
TIP SUCTION STANDARD PLASTIC TUBE FLEXIBLE TRANSPARENT SLIP RESISTANT (Suction) IMPLANT
TOOL DISSECTING L14 CM MATCH HEAD FLUTE (Burr) ×1
TOOL DISSECTING L14 CM MATCH HEAD FLUTE OD3 MM MIDAS REX LEGEND (Burr) ×1 IMPLANT
TOOL DSCT LGND 3MM 14MM (Burr) ×1
TOWEL L27 IN X W17 IN COTTON PREWASH (Other)
TOWEL L27 IN X W17 IN COTTON PREWASH DELINT HIGH ABSORBENT BLUE (Other) IMPLANT
TOWEL SRG CTTN 27X17IN LF STRL PREWASH (Other)
TRAY SRG SPINE ~~LOC~~ (Pack) ×4
TRAY SURGICAL SPINE (Pack) ×2 IMPLANT
TUBING SUCTION ID3/16 IN L10 FT (Tubing) ×2
TUBING SUCTION ID3/16 IN L10 FT NONCONDUCTIVE FML CONNECTOR MEDLN (Tubing) ×2 IMPLANT
metrx disp tubular retractor angled ×1 IMPLANT

## 2020-03-17 NOTE — Anesthesia Preprocedure Evaluation (Signed)
Anesthesia Evaluation    AIRWAY    Mallampati: III    TM distance: >3 FB  Neck ROM: full  Mouth Opening:full  Planned to use difficult airway equipment: No CARDIOVASCULAR    cardiovascular exam normal       DENTAL    no notable dental hx     PULMONARY    pulmonary exam normal     OTHER FINDINGS    79 y.o. male w hx of HLD HTN LBB GERD LBP NICMP here for Right foraminotomies     TTE 11/2019  EF 45%  Diastolic dysfxn  RV normal  No sig valve dz     hct 38 plt 252 Cr 0.7 K 4.3    Past Medical History:  No date: Cardiomyopathy      Comment:  non-ischemic  No date: Claustrophobia      Comment:  elevators and MRIs  No date: Collagenous colitis      Comment:  pt denies any symptoms   01/2010, 03/2015, 05/2017, 09/2018: echocardiogram  No date: Gastroesophageal reflux disease      Comment:  no symptoms at present.   No date: heart scan  No date: Hyperlipidemia  No date: Hypertension      Comment:  BP range 120/78 P 80 per pt 98% sat  No date: Hypothyroidism  No date: LBBB (left bundle branch block)  No date: Low back pain  No date: Lumbar spondylosis  2001, 2011: MPI ST  No date: Mumps      Comment:  usual childhood illnesses of mumps, measles, and                chickenpox  No date: Seasonal allergic rhinitis  No date: Vitamin D deficiency    Past Surgical History:  09/2010: CARDIAC CATHETERIZATION  06/21/2017: COLONOSCOPY  10/31/2018: EGD      Comment:  Dr. Rosalyn Charters- erythema  05/07/1944: TONSILLECTOMY     @ECHORESULTS @     No current facility-administered medications for this encounter.    Current Outpatient Medications:   .  ALPRAZolam (Xanax XR) 0.5 MG 24 hr tablet, Take 1 tablet (0.5 mg total) by mouth daily as needed (anxiety), Disp: 30 tablet, Rfl: 1  .  atorvastatin (LIPITOR) 20 MG tablet, Take 1 tablet (20 mg total) by mouth daily (Patient taking differently: Take 20 mg by mouth every evening  ), Disp: 90 tablet, Rfl: 1  .  carvedilol (Coreg) 6.25 MG tablet, Take 1 tablet (6.25 mg total) by mouth 2 (two) times  daily, Disp: 180 tablet, Rfl: 3  .  cetirizine (ZyrTEC Allergy) 10 MG tablet, Take 1 tablet (10 mg total) by mouth daily (Patient taking differently: Take 10 mg by mouth nightly as needed  ), Disp: 30 tablet, Rfl: 11  .  Cholecalciferol (VITAMIN D) 2000 UNITS Cap, Take 5,000 Unit by mouth every morning  , Disp: , Rfl:   .  Coenzyme Q10 (CO Q 10) 10 MG Cap, Take by mouth every morning  , Disp: , Rfl:   .  ezetimibe (ZETIA) 10 MG tablet, Take 1 tablet (10 mg total) by mouth daily (Patient taking differently: Take by mouth every morning  ), Disp: 90 tablet, Rfl: 1  .  gabapentin (NEURONTIN) 300 MG capsule, Take 300 mg by mouth 3 (three) times daily as needed, Disp: , Rfl:   .  hydrocortisone 2.5 % cream, PLACE RECTALLY TWICE A DAY, Disp: 28 g, Rfl: 0  .  Levoxyl 125 MCG tablet, TAKE ONE  TABLET BY MOUTH DAILY AT 6AM (Patient taking differently: Take 125 mcg by mouth Once a day at 6:00am  ), Disp: 90 tablet, Rfl: 1  .  lidocaine (Lidoderm) 5 %, Apply 1 patch for up to 12 hours a day, Remove & Discard patch within 12 hours, Disp: 30 patch, Rfl: 3  .  loperamide (IMODIUM) 2 MG capsule, Take 2 mg by mouth as needed for Diarrhea  , Disp: , Rfl:   .  olmesartan (BENICAR) 20 MG tablet, Take 1 tablet (20 mg total) by mouth daily 4 days a week. (Patient taking differently: Take 20 mg by mouth every morning 4 days a week.  ), Disp: 90 tablet, Rfl: 3  .  olmesartan-hydrochlorothiazide (BENICAR HCT) 20-12.5 MG per tablet, Take 1 tablet by mouth daily 3 days a week., Disp: 90 tablet, Rfl: 3  .  testosterone (ANDROGEL) 50 MG/5GM (1%) Gel, Place 5 g onto the skin daily 1 pack, 4 day /week , Disp: , Rfl:   .  ondansetron (ZOFRAN) 4 MG tablet, Take 1 tablet (4 mg total) by mouth every 8 (eight) hours as needed for Nausea, Disp: 30 tablet, Rfl: 1    Patient denies cardiac or pulmonary disease, denies heart burn sx, denies personal or family hx of anesthesia complications.  METs>4 able to climb up 2 flights of stairs                        Relevant Problems   CARDIO   (+) Essential (primary) hypertension   (+) Hemangioma of liver   (+) Left bundle branch block (LBBB)   (+) Nonobstructive atherosclerosis of coronary artery   (+) Other hemorrhoids      GI   (+) Gastroesophageal reflux disease without esophagitis      GU/RENAL   (+) Hemangioma of liver      ENDO   (+) Other specified hypothyroidism               Anesthesia Plan    ASA 3     general               (All pertinent medical history, surgical history and allergies reviewed. NPO guidelines met    Risks of anesthesia explained to patient; including the most common side effects from anesthesia--nausea, vomiting, and sore throat. Uncommon side effects including but not limited to injury to eyes, lips, teeth, gums, vocal cords, allergic reactions, and nerve injuries also explained.     Patient/family given the opportunity to ask questions and concerns were addressed. Patient understands and wishes to proceed.        )      intravenous induction   Detailed anesthesia plan: general endotracheal        Post op pain management: per surgeon and PO analgesics    informed consent obtained      pertinent labs reviewed             Signed by: Odette Fraction, MD 03/17/20 7:39 AM

## 2020-03-17 NOTE — Discharge Instr - AVS First Page (Addendum)
Ok to shower; no tub soaking  No driving if on narcotics  Activity as tolerated  No bending, lifting or twisting  Schedule follow up appointment Anesthesia Discharge Instructions: After Your Surgery  You've just had surgery. During surgery you were given medicine called anesthesia to keep you relaxed and comfortable. After surgery you may have some pain or nausea. This is common. Your doctor or nurse will show you how to take care of yourself when you go home. He or she will also answer your questions. Here are some tips for feeling better and getting well after surgery.        For the first 24 hours after your surgery:  Do not drive or use heavy equipment.   Do not make important decisions or sign legal papers.  Do not drink alcohol.  Have someone stay with you, if needed. He or she can watch for problems and help keep you safe.  Have an adult family member or friend drive you home.    Managing pain  If you have pain after surgery, pain medicine will help you feel better. Take it as ordered by your surgeon, before pain becomes severe. Also, ask your doctor about other ways to control pain. This might be with rest, ice, repositioning, and elevation. Follow any other instructions your surgeon or nurse gives you.    Tips for taking pain medicine:    Stay on schedule with your medication  Most pain relievers taken by mouth need at least 20 to 30 minutes to start to work.  Taking medicine on a schedule can help you remember to take it. Try to time your medicine so that you can take it before starting an activity. This might be before you get dressed, go for a walk, or sit down for dinner.    Common side effects of prescription pain medications  Pain medicines can cause nausea. Eat a little food before taking pain medicine to avoid this.  Constipation is a common side effect of pain medicines. Drinking lots of fluids and eating foods, such as fruits and vegetables, that are high in fiber can also help.   Call your doctor  before taking any medicines such as laxatives or stool softeners to help ease constipation. Also, ask if you should avoid any foods. Remember, do not take laxatives unless your surgeon has prescribed them  Drinking alcohol and taking pain medicines can cause dizziness and slow your breathing. It can even be deadly. Do not drink alcohol while taking pain medicines.  Pain medicines can make you react more slowly to things. Do not drive or run machinery while taking pain medicines.    Important facts about Acetaminophen (Tylenol)  Acetaminophen or Tylenol is a common pain reliever.  Check your prescription pain medication labels to see if it contains acetaminophen.  Your health care provider may tell you to take acetaminophen to help ease your pain. Ask him or her how much you should to take each day.   Some prescription pain medicines have acetaminophen and other ingredients. Using both prescription pain medicines and over the counter (OTC) acetaminophen for pain can cause you to overdose.  Max dose of acetaminophen is 4000mg /24 hours for most people. Overdosing on acetaminophen may lead to liver failure.  Read the labels on your (over the counter) medicines with care. This will help you to clearly know the list of ingredients, how much to take, and any warnings. This will prevent you from taking too much acetaminophen.  If  you have questions or do not understand the information, ask your pharmacist or health care provider to explain it to you before you take the OTC medicine.    Managing nausea  Some people have an upset stomach after surgery. This is often because of anesthesia, pain, pain medicines, or the stress of surgery. If you were on a special food plan before surgery, ask your doctor if you should follow it while you get better. The following are tips to help you manage nausea after anesthesia:  Do not push yourself to eat. Your body will tell you when to eat and how much.  Start off with clear liquids and  soup. They are easier to digest.  Next try semi-solid foods such as mashed potatoes, applesauce, and gelatin, as you feel ready.  Slowly move to solid foods. Don't eat fatty, rich, or spicy foods at first.  Do not force yourself to have 3 large meals a day. Instead eat smaller amounts more often.  Take pain medicines with a small amount of food, such as crackers or toast, to avoid nausea.     Call your surgeon if:  You have worsening pain an hour after taking pain medicine. The pain medicine may not be strong enough.  You feel too sleepy, dizzy, or groggy. The pain medicine may be too strong.  You have side effects like persistent nausea, vomiting, or skin changes such as rash, itching, or hives.   You have bleeding through your dressing or your dressing becomes saturated.  You have signs of infection such as redness, fever, or increased/foul smelling drainage.      If you have obstructive sleep apnea (OSA)  You were given anesthesia medicine during surgery to keep you comfortable and free of pain. After surgery, you may have more apnea spells because of this medicine and other medicines you were given. These episodes may last longer than usual.   At home:   Keep using the continuous positive airway pressure (CPAP) device when you sleep. Unless your health care provider tells you not to, use it when you sleep,   or take a nap; day or night. CPAP is a common device used to treat OSA.   *    Sleep/nap in upright position(ie. with pillows), or use a recliner for first week  and/or while on opioid medications or medications that are sedating.  *     Another adult needs to be with you at all times during the first 24 hours home.  *     Limit opioid use when possible and use alternative comfort measures.  * Talk with your provider before taking any pain medicine, muscle relaxants, or  sedatives. Your provider will tell you about the possible dangers of taking these         medicines.

## 2020-03-17 NOTE — Interval H&P Note (Signed)
I have reviewed the H&P, examined the patient and there are no changes.  Right leg still with L5 symptoms

## 2020-03-17 NOTE — Transfer of Care (Signed)
Anesthesia Transfer of Care Note    Patient: Wesley [name]    Procedures performed: Procedure(s):  RIGHT L4-L5, L5-S1 FORAMINOTOMIES    Anesthesia type: General ETT    Patient location:Phase I PACU    Last vitals:   Vitals:    03/17/20 1158   BP: 125/59   Pulse: 81   Temp: 36.4 C (97.6 F)   SpO2: 99%     Post pain: Patient not complaining of pain, continue current therapy                 Mental Status: awake and alert      Respiratory Function: tolerating supplemental oxygen via face mask     Cardiovascular: stable     Nausea/Vomiting: patient not complaining of nausea or vomiting     Hydration Status: adequate     Post assessment: no apparent anesthetic complications, no reportable events and no evidence of recall    Full handoff to PACU RN including preoperative and intraoperative medications and notable events, patient HDS on arrival to PACU, all questions answered.         Signed by: Odette Fraction, MD  03/17/20 11:58 AM

## 2020-03-17 NOTE — Op Note (Signed)
Procedure Date: 03/17/2020     Patient Type: V     SURGEON: Cheral Almas MD  ASSISTANT:  Myrtis Ser PA     SERVICE:  Neurosurgery.     PREOPERATIVE DIAGNOSIS:  Foraminal stenosis, right L4-L5 and L5-S1.     POSTOPERATIVE DIAGNOSIS:  Foraminal stenosis, right L4-L5 and L5-S1.     TITLE OF PROCEDURES:  1.  Minimally invasive foraminotomy, right L4-L5 and L5-S1.  2.  Microdissection using operative microscope.     ANESTHESIA:  General endotracheal.     SPECIMENS:  None.     INDICATIONS FOR PROCEDURE:  This is a 79 year old man with a bad lumbar spondylosis and refractory L5  radiculopathy and 2-level severe foraminal narrowing on the right.  We  discussed various options.  He has had other opinions, but wanted to try  minimally invasive muscle-splitting option with a tubular retractor.  We  agreed that this was a nice way to handle his problem as it is mainly  radicular.  He understands the risks and possible complications, and  limitations of surgery and agreed to proceed.     DESCRIPTION OF PROCEDURE:  The patient was taken to the operating room and trachea was carefully  intubated by the anesthesiologist.  General endotracheal anesthesia was  induced and maintained throughout the case.  He was positioned prone on a  Wilson frame.  His back gently flexed.  A 1-inch incision was marked in the  midline at approximately L5, slightly biased to the right.  The lower back  was prepped and draped in the usual sterile fashion.  Incision was then  opened and then under fluoroscopic guidance, the serial dilating technique  was used to expose the lamina of L5 with access to the L4-L5 and L5-S1  foramina.  We placed a small disposable tube to retract the muscle.  We  then brought in the operative microscope.  The rest of the case was  performed with microdissection.  Lamina of L5 was drilled off on the right  side to create a laminotomy and the medial facet at L5-S1 was found to be  quite hypertrophied and this was  also drilled out in its medial aspect.   This provided access to the spinal canal and the disk at L5-S1.  Root was  indeed compressed by the disk and facet complex, so this compressing complex was removed; however,  this was not the most symptomatic root as the more symptomatic root was L5.   Attention was then directed more rostrally.  Here, we drilled off the  remainder of the lamina and the inferior portion of the facet complex at  L4-L5.  Here, we found the root compressed in the foramen just under the  lamina from a large bone spur and this was drilled off and removed.  Once  this was done, the root assumed a more dorsal position.  It did look  somewhat chronically injured.  We created more of a foraminotomy distally  even past the pedicle line to give the nerve room for  recovery.  At this point, it appeared that the goals of the operation had  been accomplished as both L5 and S1 roots were well decompressed.  Copious  antibiotic irrigation was used.  The incision was closed in layers and  Exparel was placed in the fascia prior to final closure.  Dermabond was  placed in the skin.  The patient was then returned to the supine position,  allowed to emerge  from anesthesia.  All counts correct.     ESTIMATED BLOOD LOSS:  Less than 100 mL.     COMPLICATIONS:  No complications.     DRAINS:  No drain.     SPECIMEN:  No specimen.           D:  03/17/2020 11:51 AM by Dr. Okey Regal. Claudette Laws, MD ([id number])  T:  03/17/2020 12:00 PM by NTS      Everlean Cherry: [id number]) (Doc ID: [id number])

## 2020-03-17 NOTE — Brief Op Note (Signed)
BRIEF OP NOTE    Date Time: 03/17/20 11:45 AM    Patient Name:   Wesley [name]    Date of Operation:   03/17/2020    Providers Performing:   Surgeon(s):  Ruhomutally, Alvan Dame, [place]  Claudette Laws Bing Plume, MD    Assistant (s):   Circulator: Wynell Balloon, RN  Scrub Person: Jamey Reas, Mohammed  Team Leader: Theron Arista, RN    Operative Procedure:   Procedure(s):  RIGHT L4-L5, L5-S1 FORAMINOTOMIES  Microdissection with the operative microscope  Preoperative Diagnosis:   Pre-Op Diagnosis Codes:     * Lumbar spondylosis [M47.816]    Postoperative Diagnosis:   Post-Op Diagnosis Codes:     * Lumbar spondylosis [M47.816]    Anesthesia:   General    Estimated Blood Loss:    * No values recorded between 03/17/2020  9:53 AM and 03/17/2020 11:45 AM *  <100 cc  Implants:   * No implants in log *    Drains:   Drains: no    Specimens:   * No specimens in log *      Findings:   Right L5 root compressed in the foramen    Complications:   none      Signed by: Brunetta Genera, MD                                                                           [facility]

## 2020-03-17 NOTE — Anesthesia Postprocedure Evaluation (Signed)
Anesthesia Post Evaluation    Patient: Wesley [name]    Procedure(s):  RIGHT L4-L5, L5-S1 FORAMINOTOMIES    Anesthesia type: general    Last Vitals:   Vitals Value Taken Time   BP 115/55 03/17/20 1240   Temp 36.1 C (97 F) 03/17/20 1203   Pulse 73 03/17/20 1250   Resp 19 03/17/20 1250   SpO2 100 % 03/17/20 1250                 Anesthesia Post Evaluation:     Patient Evaluated: PACU  Patient Participation: complete - patient participated  Level of Consciousness: awake and alert    Pain Management: adequate  Multimodal analgesia pain management approach    Airway Patency: patent    Anesthetic complications: No      PONV Status: controlled    Cardiovascular status: hemodynamically stable  Respiratory status: unassisted and acceptable  Hydration status: euvolemic  Comments: Patient seen and evaluated, all questions answered, patient is satisfied with anesthestic care        Signed by: Odette Fraction, MD, 03/17/2020 2:35 PM

## 2020-03-22 ENCOUNTER — Encounter: Payer: Self-pay | Admitting: Neurological Surgery

## 2020-04-11 ENCOUNTER — Encounter (INDEPENDENT_AMBULATORY_CARE_PROVIDER_SITE_OTHER): Payer: Self-pay | Admitting: Physician Assistant

## 2020-04-12 ENCOUNTER — Encounter (INDEPENDENT_AMBULATORY_CARE_PROVIDER_SITE_OTHER): Payer: Self-pay

## 2020-04-15 ENCOUNTER — Encounter (INDEPENDENT_AMBULATORY_CARE_PROVIDER_SITE_OTHER): Payer: Self-pay | Admitting: Family Medicine

## 2020-04-15 ENCOUNTER — Encounter (INDEPENDENT_AMBULATORY_CARE_PROVIDER_SITE_OTHER): Payer: Self-pay | Admitting: Physician Assistant

## 2020-04-15 NOTE — Progress Notes (Signed)
Vit D is pending- appt in Monday. Please request an updated report for his appt Monday with Dr. Cyndie Chime.

## 2020-04-18 ENCOUNTER — Encounter (INDEPENDENT_AMBULATORY_CARE_PROVIDER_SITE_OTHER): Payer: Self-pay | Admitting: Family Medicine

## 2020-04-18 ENCOUNTER — Ambulatory Visit (INDEPENDENT_AMBULATORY_CARE_PROVIDER_SITE_OTHER): Payer: Medicare Other | Admitting: Family Medicine

## 2020-04-18 VITALS — BP 118/70 | HR 90 | Temp 97.0°F | Resp 16 | Ht 68.0 in | Wt 161.0 lb

## 2020-04-18 DIAGNOSIS — R7309 Other abnormal glucose: Secondary | ICD-10-CM

## 2020-04-18 DIAGNOSIS — R770 Abnormality of albumin: Secondary | ICD-10-CM

## 2020-04-18 DIAGNOSIS — Z789 Other specified health status: Secondary | ICD-10-CM

## 2020-04-18 DIAGNOSIS — M5090 Cervical disc disorder, unspecified, unspecified cervical region: Secondary | ICD-10-CM

## 2020-04-18 DIAGNOSIS — K219 Gastro-esophageal reflux disease without esophagitis: Secondary | ICD-10-CM

## 2020-04-18 DIAGNOSIS — B353 Tinea pedis: Secondary | ICD-10-CM

## 2020-04-18 DIAGNOSIS — R748 Abnormal levels of other serum enzymes: Secondary | ICD-10-CM

## 2020-04-18 DIAGNOSIS — E559 Vitamin D deficiency, unspecified: Secondary | ICD-10-CM

## 2020-04-18 DIAGNOSIS — M25562 Pain in left knee: Secondary | ICD-10-CM

## 2020-04-18 DIAGNOSIS — D508 Other iron deficiency anemias: Secondary | ICD-10-CM

## 2020-04-18 DIAGNOSIS — G8929 Other chronic pain: Secondary | ICD-10-CM

## 2020-04-18 DIAGNOSIS — M6282 Rhabdomyolysis: Secondary | ICD-10-CM

## 2020-04-18 DIAGNOSIS — E785 Hyperlipidemia, unspecified: Secondary | ICD-10-CM

## 2020-04-18 DIAGNOSIS — I2510 Atherosclerotic heart disease of native coronary artery without angina pectoris: Secondary | ICD-10-CM

## 2020-04-18 DIAGNOSIS — R6889 Other general symptoms and signs: Secondary | ICD-10-CM

## 2020-04-18 DIAGNOSIS — F419 Anxiety disorder, unspecified: Secondary | ICD-10-CM

## 2020-04-18 DIAGNOSIS — R945 Abnormal results of liver function studies: Secondary | ICD-10-CM

## 2020-04-18 DIAGNOSIS — R110 Nausea: Secondary | ICD-10-CM

## 2020-04-18 DIAGNOSIS — M5136 Other intervertebral disc degeneration, lumbar region: Secondary | ICD-10-CM

## 2020-04-18 DIAGNOSIS — E038 Other specified hypothyroidism: Secondary | ICD-10-CM

## 2020-04-18 DIAGNOSIS — I10 Essential (primary) hypertension: Secondary | ICD-10-CM

## 2020-04-18 MED ORDER — EZETIMIBE 10 MG PO TABS
10.0000 mg | ORAL_TABLET | Freq: Every day | ORAL | 1 refills | Status: DC
Start: 2020-04-18 — End: 2020-04-18

## 2020-04-18 MED ORDER — ONDANSETRON HCL 4 MG PO TABS
4.0000 mg | ORAL_TABLET | Freq: Three times a day (TID) | ORAL | 1 refills | Status: DC | PRN
Start: 2020-04-18 — End: 2020-04-18

## 2020-04-18 MED ORDER — LIDOCAINE 5 % EX PTCH
MEDICATED_PATCH | CUTANEOUS | 3 refills | Status: DC
Start: 2020-04-18 — End: 2020-04-18

## 2020-04-18 MED ORDER — LIDOCAINE 5 % EX PTCH
MEDICATED_PATCH | CUTANEOUS | 3 refills | Status: DC
Start: 2020-04-18 — End: 2020-06-27

## 2020-04-18 MED ORDER — ALPRAZOLAM ER 0.5 MG PO TB24
0.5000 mg | ORAL_TABLET | Freq: Every day | ORAL | 2 refills | Status: DC | PRN
Start: 2020-04-18 — End: 2020-04-18

## 2020-04-18 MED ORDER — CLOTRIMAZOLE-BETAMETHASONE 1-0.05 % EX CREA
TOPICAL_CREAM | CUTANEOUS | 1 refills | Status: DC
Start: 2020-04-18 — End: 2021-04-24

## 2020-04-18 MED ORDER — ATORVASTATIN CALCIUM 20 MG PO TABS
20.0000 mg | ORAL_TABLET | Freq: Every evening | ORAL | 1 refills | Status: DC
Start: 2020-04-18 — End: 2020-04-18

## 2020-04-18 MED ORDER — CLOTRIMAZOLE-BETAMETHASONE 1-0.05 % EX CREA
TOPICAL_CREAM | CUTANEOUS | 1 refills | Status: DC
Start: 2020-04-18 — End: 2020-04-18

## 2020-04-18 MED ORDER — LEVOXYL 125 MCG PO TABS
125.0000 ug | ORAL_TABLET | Freq: Every day | ORAL | 1 refills | Status: AC
Start: 2020-04-18 — End: ?

## 2020-04-18 MED ORDER — ALPRAZOLAM ER 0.5 MG PO TB24
0.5000 mg | ORAL_TABLET | Freq: Every day | ORAL | 2 refills | Status: DC | PRN
Start: 2020-04-18 — End: 2020-06-27

## 2020-04-18 MED ORDER — EZETIMIBE 10 MG PO TABS
10.0000 mg | ORAL_TABLET | Freq: Every day | ORAL | 1 refills | Status: AC
Start: 2020-04-18 — End: ?

## 2020-04-18 MED ORDER — ATORVASTATIN CALCIUM 20 MG PO TABS
20.0000 mg | ORAL_TABLET | Freq: Every evening | ORAL | 1 refills | Status: DC
Start: 2020-04-18 — End: 2020-04-25

## 2020-04-18 MED ORDER — LEVOXYL 125 MCG PO TABS
125.0000 ug | ORAL_TABLET | Freq: Every day | ORAL | 1 refills | Status: DC
Start: 2020-04-18 — End: 2020-04-18

## 2020-04-18 MED ORDER — ONDANSETRON HCL 4 MG PO TABS
4.0000 mg | ORAL_TABLET | Freq: Three times a day (TID) | ORAL | 1 refills | Status: DC | PRN
Start: 2020-04-18 — End: 2020-06-27

## 2020-04-18 NOTE — Progress Notes (Signed)
Pt is here today  Need medication refill   Pt takes medication as prescribed   Denies  any side effects from medication in the past.    Pt also need antifungal for his foot.  - need PT for neck and shoulder                  [facility]                       Date of Exam: 04/18/2020 1:41 PM        Patient ID: Wesley [name] is a 79 y.o. male.  Attending Physician: Maebelle Munroe, DO        Chief Complaint:    Chief Complaint   Patient presents with   . Medication Refill               HPI:    Here for anxiety follow up, uses xanax about 2 times a week, only in certain stressful situations.  No depression.    Pt also here to follow up on high cholesterol, did have recent lipid labs.  On lipitor without muscle aches.  Does well with watching fat in diet and exercising.   Patient denies any chest pain, sob, headaches, visual changes.   Pt does get nausea at times, has hx of gerd as well, seen by GI, Dr. Allena Katz.  Uses zofran as needed which helps.   Did have egd last year.  No black stools.     Pt also follow up on hypothyroid, on synthroid .  Patient denies any fatigue, temperature intolerance, weight changes.     Pt does have known cervical and lumbar degenerative disc, doing PT which helps.  Wants to continue PT.            Problem List:    Patient Active Problem List   Diagnosis   . Left bundle branch block (LBBB)   . Anxiety   . Cervical disc disorder   . Claustrophobia   . Hemangioma of liver   . Hyperlipidemia LDL goal <70   . Other specified hypothyroidism   . Nausea   . Vitamin D deficiency   . Abnormal glucose   . Cardiomyopathy   . Collagenous colitis   . Gastroesophageal reflux disease without esophagitis   . Left hip pain   . Localized osteoarthritis of left knee   . Nonobstructive atherosclerosis of coronary artery   . Iron deficiency anemia   . Tinea pedis of right foot   . Chronic pain of left knee   . Other hemorrhoids   . Sebaceous cyst   . Essential (primary)  hypertension   . Unknown status of immunity to COVID-19 virus   . DDD (degenerative disc disease), lumbar   . Retrolisthesis of vertebrae             Current Meds:    Outpatient Medications Marked as Taking for the 04/18/20 encounter (Office Visit) with Maebelle Munroe, DO   Medication Sig Dispense Refill   . ALPRAZolam (Xanax XR) 0.5 MG 24 hr tablet Take 1 tablet (0.5 mg total) by mouth daily as needed (anxiety) 30 tablet 2   . atorvastatin (LIPITOR) 20 MG tablet Take 1 tablet (20 mg total) by mouth every evening 90 tablet 1   . carvedilol (Coreg) 6.25 MG tablet Take 1 tablet (6.25 mg total) by mouth 2 (two) times daily 180 tablet 3   .  cetirizine (ZyrTEC Allergy) 10 MG tablet Take 1 tablet (10 mg total) by mouth daily (Patient taking differently: Take 10 mg by mouth nightly as needed   ) 30 tablet 11   . Cholecalciferol (VITAMIN D) 2000 UNITS Cap Take 5,000 Unit by mouth every morning        . Coenzyme Q10 (CO Q 10) 10 MG Cap Take by mouth every morning        . ezetimibe (ZETIA) 10 MG tablet Take 1 tablet (10 mg total) by mouth daily 90 tablet 1   . gabapentin (NEURONTIN) 300 MG capsule Take 300 mg by mouth 3 (three) times daily as needed     . Levoxyl 125 MCG tablet Take 1 tablet (125 mcg total) by mouth Once a day at 6:00am 90 tablet 1   . lidocaine (Lidoderm) 5 % Apply 1 patch for up to 12 hours a day, Remove & Discard patch within 12 hours 30 patch 3   . loperamide (IMODIUM) 2 MG capsule Take 2 mg by mouth as needed for Diarrhea        . olmesartan (BENICAR) 20 MG tablet Take 1 tablet (20 mg total) by mouth daily 4 days a week. (Patient taking differently: Take 20 mg by mouth every morning 4 days a week.   ) 90 tablet 3   . olmesartan-hydrochlorothiazide (BENICAR HCT) 20-12.5 MG per tablet Take 1 tablet by mouth daily 3 days a week. 90 tablet 3   . ondansetron (ZOFRAN) 4 MG tablet Take 1 tablet (4 mg total) by mouth every 8 (eight) hours as needed for Nausea 30 tablet 1   . testosterone (ANDROGEL) 50 MG/5GM  (1%) Gel Place 5 g onto the skin daily 1 pack, 4 day /week       . [DISCONTINUED] ALPRAZolam (Xanax XR) 0.5 MG 24 hr tablet Take 1 tablet (0.5 mg total) by mouth daily as needed (anxiety) 30 tablet 1   . [DISCONTINUED] ALPRAZolam (Xanax XR) 0.5 MG 24 hr tablet Take 1 tablet (0.5 mg total) by mouth daily as needed (anxiety) 30 tablet 2   . [DISCONTINUED] atorvastatin (LIPITOR) 20 MG tablet Take 1 tablet (20 mg total) by mouth daily (Patient taking differently: Take 20 mg by mouth every evening   ) 90 tablet 1   . [DISCONTINUED] atorvastatin (LIPITOR) 20 MG tablet Take 1 tablet (20 mg total) by mouth every evening 90 tablet 1   . [DISCONTINUED] ezetimibe (ZETIA) 10 MG tablet Take 1 tablet (10 mg total) by mouth daily (Patient taking differently: Take by mouth every morning   ) 90 tablet 1   . [DISCONTINUED] ezetimibe (ZETIA) 10 MG tablet Take 1 tablet (10 mg total) by mouth daily 90 tablet 1   . [DISCONTINUED] Levoxyl 125 MCG tablet TAKE ONE TABLET BY MOUTH DAILY AT 6AM (Patient taking differently: Take 125 mcg by mouth Once a day at 6:00am   ) 90 tablet 1   . [DISCONTINUED] Levoxyl 125 MCG tablet Take 1 tablet (125 mcg total) by mouth Once a day at 6:00am 90 tablet 1   . [DISCONTINUED] lidocaine (Lidoderm) 5 % Apply 1 patch for up to 12 hours a day, Remove & Discard patch within 12 hours 30 patch 3   . [DISCONTINUED] lidocaine (Lidoderm) 5 % Apply 1 patch for up to 12 hours a day, Remove & Discard patch within 12 hours 30 patch 3   . [DISCONTINUED] ondansetron (ZOFRAN) 4 MG tablet Take 1 tablet (4 mg total) by mouth every  8 (eight) hours as needed for Nausea 30 tablet 1   . [DISCONTINUED] ondansetron (ZOFRAN) 4 MG tablet Take 1 tablet (4 mg total) by mouth every 8 (eight) hours as needed for Nausea 30 tablet 1          Allergies:    No Known Allergies          Past Surgical History:    Past Surgical History:   Procedure Laterality Date   . CARDIAC CATHETERIZATION  09/2010   . COLONOSCOPY  06/21/2017   . EGD   10/31/2018    Dr. Rosalyn Charters- erythema   . LAMINECTOMY, POSTERIOR LUMBAR, DECOMPRESSION, LEVEL 2 Right 03/17/2020    Procedure: RIGHT L4-L5, L5-S1 FORAMINOTOMIES;  Surgeon: Brunetta Genera, MD;  Location: [facility];  Service: Neurosurgery;  Laterality: Right;   . TONSILLECTOMY  [birth year]/07[birth year]1944           Family History:    Family History   Problem Relation Age of Onset   . Migraines Mother    . Stomach cancer Sister         Died age 52   . Thyroid disease Sister 29   . Colon cancer Other         Cousin   . Diabetes Father            Social History:    Social History     Tobacco Use   . Smoking status: Former Smoker     Packs/day: 1.00   . Smokeless tobacco: Never Used   . Tobacco comment: quit 40 years ago    Vaping Use   . Vaping Use: Never used   Substance Use Topics   . Alcohol use: Never   . Drug use: Never          The following sections were reviewed this encounter by the provider:   Tobacco  Allergies  Meds  Problems  Med Hx  Surg Hx  Fam Hx             Vital Signs:    BP 118/70 (BP Site: Left arm, Patient Position: Sitting, Cuff Size: Medium)   Pulse 90   Temp 97 F (36.1 C) (Temporal)   Resp 16   Ht 1.727 m (5\' 8" )   Wt 73 kg (161 lb)   SpO2 97%   BMI 24.48 kg/m          ROS:    Review of Systems   Constitutional: Negative for chills, diaphoresis, fatigue and fever.   Eyes: Negative for photophobia, discharge, redness and itching.   Respiratory: Negative for cough, chest tightness, shortness of breath and wheezing.    Cardiovascular: Negative for chest pain, palpitations and leg swelling.   Gastrointestinal: Positive for nausea. Negative for abdominal pain, blood in stool, constipation, diarrhea and vomiting.   Endocrine: Negative for cold intolerance, heat intolerance, polydipsia, polyphagia and polyuria.   Genitourinary: Negative for dysuria, flank pain, frequency, hematuria and urgency.   Musculoskeletal: Positive for back pain and neck pain. Negative for arthralgias and myalgias.    Skin: Negative for pallor and rash.   Neurological: Negative for dizziness, weakness, light-headedness, numbness and headaches.   Hematological: Negative for adenopathy. Does not bruise/bleed easily.   Psychiatric/Behavioral: Negative for dysphoric mood, hallucinations, self-injury, sleep disturbance and suicidal ideas. The patient is nervous/anxious. The patient is not hyperactive.    All other systems reviewed and are negative.  Physical Exam:    Physical Exam  Vitals and nursing note reviewed.   Constitutional:       General: He is not in acute distress.     Appearance: Normal appearance. He is not ill-appearing, toxic-appearing or diaphoretic.   HENT:      Head: Normocephalic and atraumatic.   Eyes:      General: No scleral icterus.        Right eye: No discharge.         Left eye: No discharge.      Extraocular Movements: Extraocular movements intact.      Conjunctiva/sclera: Conjunctivae normal.      Pupils: Pupils are equal, round, and reactive to light.   Cardiovascular:      Rate and Rhythm: Normal rate and regular rhythm.      Heart sounds: Normal heart sounds. No murmur heard.  No gallop.    Pulmonary:      Effort: Pulmonary effort is normal. No respiratory distress.      Breath sounds: Normal breath sounds. No wheezing or rales.   Chest:      Chest wall: No tenderness.   Abdominal:      General: Abdomen is flat. There is no distension.      Palpations: Abdomen is soft. There is no mass.      Tenderness: There is no abdominal tenderness. There is no right CVA tenderness, left CVA tenderness, guarding or rebound.      Hernia: No hernia is present.   Musculoskeletal:         General: No tenderness (over lumbar or cervical spine).      Cervical back: Neck supple. No muscular tenderness.      Right lower leg: No edema.      Left lower leg: No edema.   Lymphadenopathy:      Cervical: No cervical adenopathy.   Skin:     Coloration: Skin is not pale.      Findings: No rash.   Neurological:       General: No focal deficit present.      Mental Status: He is alert and oriented to person, place, and time.   Psychiatric:         Mood and Affect: Mood normal.         Behavior: Behavior normal.         Thought Content: Thought content normal.         Judgment: Judgment normal.              Assessment:    1. Anxiety  - ALPRAZolam (Xanax XR) 0.5 MG 24 hr tablet; Take 1 tablet (0.5 mg total) by mouth daily as needed (anxiety)  Dispense: 30 tablet; Refill: 2    2. Hyperlipidemia LDL goal <70  - Creatine Kinase (CK)  - Comprehensive metabolic panel  - atorvastatin (LIPITOR) 20 MG tablet; Take 1 tablet (20 mg total) by mouth every evening  Dispense: 90 tablet; Refill: 1  - ezetimibe (ZETIA) 10 MG tablet; Take 1 tablet (10 mg total) by mouth daily  Dispense: 90 tablet; Refill: 1    3. Other specified hypothyroidism  - TSH  - T4, free  - T3, free  - Thyroid peroxidase antibody  - Thyroglobulin Antibody  - Levoxyl 125 MCG tablet; Take 1 tablet (125 mcg total) by mouth Once a day at 6:00am  Dispense: 90 tablet; Refill: 1    4. DDD (degenerative disc disease), lumbar  - Referral to Physical  Therapy - EXTERNAL    5. Chronic pain of left knee  - lidocaine (Lidoderm) 5 %; Apply 1 patch for up to 12 hours a day, Remove & Discard patch within 12 hours  Dispense: 30 patch; Refill: 3    6. Nausea  - ondansetron (ZOFRAN) 4 MG tablet; Take 1 tablet (4 mg total) by mouth every 8 (eight) hours as needed for Nausea  Dispense: 30 tablet; Refill: 1    7. Vitamin D deficiency    8. Cervical disc disorder  - Referral to Physical Therapy - EXTERNAL    9. Abnormal glucose  - Hemoglobin A1C    10. Other iron deficiency anemia  - CBC and differential    11. Essential (primary) hypertension    12. Tinea pedis of right foot  - clotrimazole-betamethasone (LOTRISONE) cream; Apply to affected area 2 times daily  Dispense: 45 g; Refill: 1    13. Unknown status of immunity to COVID-19 virus  - SARS-CoV-2 Total Antibody Spike Semi QN    14.  Gastroesophageal reflux disease without esophagitis  - Magnesium  - Vitamin B12    15. Nonobstructive atherosclerosis of coronary artery  - High sensitivity CRP    16. Other general symptoms and signs   - Vitamin B12            Plan:      Essential (primary) hypertension  A/P: Stable, continue current treatment regimen.   Encourage therapeutic lifestyle changes including low fat diet, decrease salt and increase exercise, monitor blood pressure a few times a week at home, normal blood pressure is 120/80, call or follow office visit if blood pressure continues to be elevated.    Hyperlipidemia LDL goal <70  A/P: Will recheck labs, further management pending lab results.    Nonobstructive atherosclerosis of coronary artery  A/P:Stable, continue current treatment regimen.   Patient to continue to follow up with Cardiologist specialist.  Go to ER if increase chest pain or shortness of breath.    Gastroesophageal reflux disease without esophagitis  A/P: Stable, continue current treatment regimen.  Patient to continue to follow up with GI specialist.    Other specified hypothyroidism  A/P: Will recheck labs, further management pending lab results.    Cervical disc disorder  A/P: refer to PT    DDD (degenerative disc disease), lumbar  A/P:s/p surgery, f/u with neurosurgeon.  F/u with PT    Tinea pedis of right foot  A/P: improved    Abnormal glucose  A/P: Will recheck labs, further management pending lab results.    Anxiety  A/P: Stable, continue current treatment regimen.  Discussed with patient that medication can be addictive and is a controlled substance, patient understands and is agreeable to be careful with use.    Chronic pain of left knee  A/P: Stable, continue current treatment regimen.    Iron deficiency anemia  A/P: Will recheck labs, further management pending lab results.    Nausea  A/P: Stable, continue current treatment regimen.    Unknown status of immunity to COVID-19 virus  A/P:  Patient advised that I am  unsure of coverage by insurance regarding covid quantitative antibody testing.  Patient would like to proceed with testing.      Vitamin D deficiency  A/P: Patient advised that vitamin D test may not get covered by insurance. Benefits of vitamin d testing explained.     Patient Instructions     Treating Anxiety Disorders with Medicine  An anxiety disorder can make  you feel nervous or apprehensive,even without a clearreason. In people age 33 and older, generalized anxiety disorder is one of the most commonly diagnosed anxiety disorders.Many times it occurs with depression. Certain anxiety disorders can cause intense feelings of fear or panic. You may even havephysical symptoms such as a racing heartbeat, sweating, or dizziness. If you have these feelings, you don't have to suffer anymore. Treatment to help you overcome your fears will likely include therapy (also called counseling). Medicine may also be prescribed to help control your symptoms.     Medicines  Certain medicines may be prescribed to help control your symptoms. So you may feel less anxious. You may also feel able to move forward with therapy. At first, medicines and dosages may need to be adjusted to find what works best for you. Try to be patient. Tell your healthcare provider how a medicine makes you feel. This way, you can work together to find the treatment that's best for you. Keep in mind that medicines can have side effects. Talk with your provider about any side effects that are bothering you. Changing the dose or type of medicine may help. Don't stop taking medicine on your own. That can cause symptoms to come back or cause dangerous withdrawal symptoms.    Anti-anxiety medicine. This medicine eases symptoms and helps you relax. Your healthcare provider will explain when and how to use it. It may be prescribed for use before situations that make you anxious. You may also be told to take medicine on a regular schedule. Anti-anxiety medicine  may make you feel a little sleepy or "out of it." Don't drive a car or operate machinery while on this medicine, until you know how it affects you.  Never use alcohol or other drugs with anti-anxiety medicines. This could result in loss of muscular control, sedation, coma, or death. Also, use only the amount of medicine prescribed for you. If you think you may have taken too much, get emergency care right away. Never share your medicines with others. Store these medicines in a safe place that can't be reached by children or visitors.   Keep taking medicines as prescribed  Never change your dosage, share or use another person's medicine, or stop taking your medicines without talking to your healthcare provider first. Keep the following in mind:    Some medicines must be taken on a schedule. Make this part of your daily routine. For instance, always take your pill before brushing your teeth. A pillbox can help you remember if you've taken your medicine each day.   Medicines are often taken for6 to 12 months. Your healthcare provider will then evaluate whether you need to stay on them. Many people who have also had therapy may no longer need medicine to manage anxiety.   You may need to stop taking medicine slowly to give your body time to adjust. When it's time to stop, your healthcare provider will tell you more. Remember: Never stop taking your medicine without talking to your provider first.   If symptoms return, you may need to start taking medicines again.  This isn't your fault. It's just the nature of your anxiety disorder.  What to think about   Side effects.Medicines may cause side effects. Ask your healthcare provider or pharmacist what you can expect. They may have ideas for avoiding some side effects.   Sexual problems. Some antidepressants can affect your desire for sex or your ability to have an orgasm. A change in dosage or  medicine often solves the problem. If you have a sexual side effect that  concerns you, tell your healthcare provider.   Addiction. If you've never had a problem with drugs or alcohol, you may not have a problem with medicines used to treat anxiety disorders. But always discuss the medicines with your healthcare provider before taking them. If you have a history of addiction, you may not be able to use certain medicines used to treat anxiety disorders.   Medicine interactions. Always check with your pharmacist before using any over-the-counter medicines (OTCs), including herbal supplements. Some OTCs may interact with your anti-anxiety medicines and increase or decrease their effectiveness.    [employer]Well last reviewed this educational content on 04/06/2018   2000-2021 [employer], [place]. All rights reserved. This information is not intended as a substitute for professional medical care. Always follow your healthcare professional's instructions.                Follow-up:    Return in about 3 months (around 07/17/2020), or if symptoms worsen or fail to improve, for High Cholesterol, HTN.         Maebelle Munroe, DO

## 2020-04-19 ENCOUNTER — Ambulatory Visit (INDEPENDENT_AMBULATORY_CARE_PROVIDER_SITE_OTHER): Payer: Medicare Other | Admitting: Family Medicine

## 2020-04-19 LAB — HEMOGLOBIN A1C: Hemoglobin A1C: 5.4 % (ref 4.8–5.6)

## 2020-04-19 LAB — T4, FREE: T4, Free: 1.29 ng/dL (ref 0.82–1.77)

## 2020-04-19 LAB — COMPREHENSIVE METABOLIC PANEL
ALT: 46 IU/L — ABNORMAL HIGH (ref 0–44)
AST (SGOT): 73 IU/L — ABNORMAL HIGH (ref 0–40)
African American eGFR: 88 mL/min/{1.73_m2} (ref >59)
Albumin/Globulin Ratio: 2.2 (ref 1.2–2.2)
Albumin: 4.8 g/dL — ABNORMAL HIGH (ref 3.7–4.7)
Alkaline Phosphatase: 78 IU/L (ref 44–121)
BUN / Creatinine Ratio: 15 (ref 10–24)
BUN: 14 mg/dL (ref 8–27)
Bilirubin, Total: 0.5 mg/dL (ref 0.0–1.2)
CO2: 25 mmol/L (ref 20–29)
Calcium: 9.9 mg/dL (ref 8.6–10.2)
Chloride: 98 mmol/L (ref 96–106)
Creatinine: 0.95 mg/dL (ref 0.76–1.27)
Globulin, Total: 2.2 g/dL (ref 1.5–4.5)
Glucose: 87 mg/dL (ref 65–99)
Potassium: 4.3 mmol/L (ref 3.5–5.2)
Protein, Total: 7 g/dL (ref 6.0–8.5)
Sodium: 137 mmol/L (ref 134–144)
non-African American eGFR: 76 mL/min/{1.73_m2} (ref >59)

## 2020-04-19 LAB — CBC AND DIFFERENTIAL
Baso(Absolute): 0.1 10*3/uL (ref 0.0–0.2)
Basos: 1 %
Eos: 3 %
Eosinophils Absolute: 0.2 10*3/uL (ref 0.0–0.4)
Hematocrit: 36.8 % — ABNORMAL LOW (ref 37.5–51.0)
Hemoglobin: 12.5 g/dL — ABNORMAL LOW (ref 13.0–17.7)
Immature Granulocytes Absolute: 0 10*3/uL (ref 0.0–0.1)
Immature Granulocytes: 0 %
Lymphocytes Absolute: 1.1 10*3/uL (ref 0.7–3.1)
Lymphocytes: 19 %
MCH: 27.5 pg (ref 26.6–33.0)
MCHC: 34 g/dL (ref 31.5–35.7)
MCV: 81 fL (ref 79–97)
Monocytes Absolute: 0.9 10*3/uL (ref 0.1–0.9)
Monocytes: 15 %
Neutrophils Absolute: 3.6 10*3/uL (ref 1.4–7.0)
Neutrophils: 62 %
Platelets: 233 10*3/uL (ref 150–450)
RBC: 4.54 x10E6/uL (ref 4.14–5.80)
RDW: 13.2 % (ref 11.6–15.4)
WBC: 5.9 10*3/uL (ref 3.4–10.8)

## 2020-04-19 LAB — MAGNESIUM: Magnesium: 2.2 mg/dL (ref 1.6–2.3)

## 2020-04-19 LAB — SARS-COV-2 TOTAL ANTIBODY, SPIKE SEMI-QUANTITATIVE
SARS-CoV-2 Antibody Interpretation: POSITIVE
SARS-CoV-2 Semi-Quant Total Antibody: 1545 U/mL (ref <0.8)

## 2020-04-19 LAB — CK: Creatinine Kinase, Total: 2382 U/L (ref 41–331)

## 2020-04-19 LAB — TSH: TSH: 1.07 u[IU]/mL (ref 0.450–4.500)

## 2020-04-19 LAB — THYROGLOBULIN ANTIBODY: THYROGLOBULIN AB: 38.7 IU/mL — ABNORMAL HIGH (ref 0.0–0.9)

## 2020-04-19 LAB — HIGH SENSITIVITY CRP: C-Reactive Protein, High Sensitive: 0.77 mg/L (ref 0.00–3.00)

## 2020-04-19 LAB — T3, FREE: T3, Free: 3.1 pg/mL (ref 2.0–4.4)

## 2020-04-19 LAB — THYROID PEROXIDASE ANTIBODY: Thyroid Peroxidase (TPO) AB: 27 IU/mL (ref 0–34)

## 2020-04-19 LAB — VITAMIN B12: Vitamin B-12: 579 pg/mL (ref 232–1245)

## 2020-04-19 NOTE — Assessment & Plan Note (Signed)
A/P: Stable, continue current treatment regimen.  Patient to continue to follow up with GI specialist.

## 2020-04-19 NOTE — Assessment & Plan Note (Signed)
A/P: Stable, continue current treatment regimen.  Discussed with patient that medication can be addictive and is a controlled substance, patient understands and is agreeable to be careful with use.

## 2020-04-19 NOTE — Assessment & Plan Note (Signed)
A/P: Will recheck labs, further management pending lab results.

## 2020-04-19 NOTE — Assessment & Plan Note (Signed)
A/P: Patient advised that vitamin D test may not get covered by insurance. Benefits of vitamin d testing explained.

## 2020-04-19 NOTE — Assessment & Plan Note (Signed)
A/P: Stable, continue current treatment regimen.

## 2020-04-19 NOTE — Assessment & Plan Note (Signed)
A/P: refer to PT

## 2020-04-19 NOTE — Assessment & Plan Note (Signed)
A/P: Stable, continue current treatment regimen.   Patient to continue to follow up with Cardiologist specialist.  Go to ER if increase chest pain or shortness of breath.

## 2020-04-19 NOTE — Patient Instructions (Signed)
Treating Anxiety Disorders with Medicine  An anxiety disorder can make you feel nervous or apprehensive, even without a clear reason. In people age 78 and older, generalized anxiety disorder is one of the most commonly diagnosed anxiety disorders. Many times it occurs with depression. Certain anxiety disorders can cause intense feelings of fear or panic. You may even have physical symptoms such as a racing heartbeat, sweating, or dizziness. If you have these feelings, you don’t have to suffer anymore. Treatment to help you overcome your fears will likely include therapy (also called counseling). Medicine may also be prescribed to help control your symptoms.     Medicines  Certain medicines may be prescribed to help control your symptoms. So you may feel less anxious. You may also feel able to move forward with therapy. At first, medicines and dosages may need to be adjusted to find what works best for you. Try to be patient. Tell your healthcare provider how a medicine makes you feel. This way, you can work together to find the treatment that’s best for you. Keep in mind that medicines can have side effects. Talk with your provider about any side effects that are bothering you. Changing the dose or type of medicine may help. Don’t stop taking medicine on your own. That can cause symptoms to come back or cause dangerous withdrawal symptoms.   · Anti-anxiety medicine. This medicine eases symptoms and helps you relax. Your healthcare provider will explain when and how to use it. It may be prescribed for use before situations that make you anxious. You may also be told to take medicine on a regular schedule. Anti-anxiety medicine may make you feel a little sleepy or “out of it.” Don’t drive a car or operate machinery while on this medicine, until you know how it affects you.  Never use alcohol or other drugs with anti-anxiety medicines. This could result in loss of muscular control, sedation, coma, or death. Also,  use only the amount of medicine prescribed for you. If you think you may have taken too much, get emergency care right away. Never share your medicines with others. Store these medicines in a safe place that can't be reached by children or visitors.   Keep taking medicines as prescribed  Never change your dosage, share or use another person's medicine, or stop taking your medicines without talking to your healthcare provider first. Keep the following in mind:   · Some medicines must be taken on a schedule. Make this part of your daily routine. For instance, always take your pill before brushing your teeth. A pillbox can help you remember if you’ve taken your medicine each day.  · Medicines are often taken for 6 to 12 months. Your healthcare provider will then evaluate whether you need to stay on them. Many people who have also had therapy may no longer need medicine to manage anxiety.  · You may need to stop taking medicine slowly to give your body time to adjust. When it’s time to stop, your healthcare provider will tell you more. Remember: Never stop taking your medicine without talking to your provider first.  · If symptoms return, you may need to start taking medicines again.  This isn’t your fault. It’s just the nature of your anxiety disorder.  What to think about  · Side effects. Medicines may cause side effects. Ask your healthcare provider or pharmacist what you can expect. They may have ideas for avoiding some side effects.  · Sexual problems. Some antidepressants can affect your desire for sex or   your ability to have an orgasm. A change in dosage or medicine often solves the problem. If you have a sexual side effect that concerns you, tell your healthcare provider.  · Addiction. If you’ve never had a problem with drugs or alcohol, you may not have a problem with medicines used to treat anxiety disorders. But always discuss the medicines with your healthcare provider before taking them. If you have a  history of addiction, you may not be able to use certain medicines used to treat anxiety disorders.  · Medicine interactions. Always check with your pharmacist before using any over-the-counter medicines (OTCs), including herbal supplements. Some OTCs may interact with your anti-anxiety medicines and increase or decrease their effectiveness.    [employer]Well last reviewed this educational content on 04/06/2018    © 2000-2021 [employer]. All rights reserved. This information is not intended as a substitute for professional medical care. Always follow your healthcare professional's instructions.

## 2020-04-19 NOTE — Assessment & Plan Note (Signed)
A/P: improved

## 2020-04-19 NOTE — Assessment & Plan Note (Signed)
A/P:  Patient advised that I am unsure of coverage by insurance regarding covid quantitative antibody testing.  Patient would like to proceed with testing.

## 2020-04-19 NOTE — Assessment & Plan Note (Signed)
A/P: Stable, continue current treatment regimen.   Encourage therapeutic lifestyle changes including low fat diet, decrease salt and increase exercise, monitor blood pressure a few times a week at home, normal blood pressure is 120/80, call or follow office visit if blood pressure continues to be elevated.

## 2020-04-19 NOTE — Assessment & Plan Note (Signed)
A/P:s/p surgery, f/u with neurosurgeon.  F/u with PT

## 2020-04-20 ENCOUNTER — Encounter (INDEPENDENT_AMBULATORY_CARE_PROVIDER_SITE_OTHER): Payer: Self-pay | Admitting: Family Medicine

## 2020-04-21 ENCOUNTER — Telehealth (INDEPENDENT_AMBULATORY_CARE_PROVIDER_SITE_OTHER): Payer: Self-pay | Admitting: Family Medicine

## 2020-04-21 ENCOUNTER — Encounter (INDEPENDENT_AMBULATORY_CARE_PROVIDER_SITE_OTHER): Payer: Self-pay | Admitting: Family Medicine

## 2020-04-21 DIAGNOSIS — M6282 Rhabdomyolysis: Secondary | ICD-10-CM

## 2020-04-21 NOTE — Telephone Encounter (Signed)
Yes, order placed

## 2020-04-21 NOTE — Telephone Encounter (Signed)
S/w pt, Pt stated that he has CK lab done at the ER [facility], Pt CK was drop dome around 600, Pt would like to know if he should come tomorrow 12/17 foe BW, please advise, ty

## 2020-04-22 ENCOUNTER — Ambulatory Visit (INDEPENDENT_AMBULATORY_CARE_PROVIDER_SITE_OTHER): Payer: Medicare Other

## 2020-04-22 ENCOUNTER — Other Ambulatory Visit (INDEPENDENT_AMBULATORY_CARE_PROVIDER_SITE_OTHER): Payer: Self-pay | Admitting: Family Medicine

## 2020-04-22 DIAGNOSIS — R770 Abnormality of albumin: Secondary | ICD-10-CM | POA: Insufficient documentation

## 2020-04-22 DIAGNOSIS — E785 Hyperlipidemia, unspecified: Secondary | ICD-10-CM

## 2020-04-22 DIAGNOSIS — R945 Abnormal results of liver function studies: Secondary | ICD-10-CM

## 2020-04-22 DIAGNOSIS — M6282 Rhabdomyolysis: Secondary | ICD-10-CM

## 2020-04-22 DIAGNOSIS — R748 Abnormal levels of other serum enzymes: Secondary | ICD-10-CM | POA: Insufficient documentation

## 2020-04-22 NOTE — Addendum Note (Signed)
Addended by: Maebelle Munroe on: 04/22/2020 08:10 AM     Modules accepted: Orders

## 2020-04-22 NOTE — Progress Notes (Signed)
Covid antibody testing shows patient does have antibodies with immunity due to covid vaccine.   Antibody level is 1545.  Max detectable antibody level is above 2500.       C reactive protein puts patient at low cardiovascular risk.    Your TSH thyroid hormone levels are in normal range.    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 will monitor at least annually.  See link below for additional information.    [email or web address]     Your Hemoglobin A1C test which is an average of how patient blood sugar is over 3 months is in normal range, no prediabetes or diabetes present.    Your complete blood count shows mild stable anemia, increase iron in diet such as shellfish, spinach, red meat. Can also take over the counter iron sulfate 325mg  a day, recheck at future visits. If anemia continues, will need further work up. Watch for any rectal bleeding or black stools.    Ck already addressed with pt, will recheck.    Sodium, potassium, electrolytes, calcium, glucose, kidney  normal.    Your albumin level is elevated possibly due to dehydration, increase fluids, recheck lab visit with protein electrophoresis in 1 week to assess for abnormal proteins.    Liver enzymes are elevated, recheck liver function with hepatitis at lab visit in 1 week, avoid alcohol if appropriate, increase fluid intake to at least 64 ounces of water a day    Your magnesium levels are normal.    Your vitamin B12 level  is in normal range.

## 2020-04-25 ENCOUNTER — Ambulatory Visit (INDEPENDENT_AMBULATORY_CARE_PROVIDER_SITE_OTHER): Payer: Medicare Other | Admitting: Family Medicine

## 2020-04-25 ENCOUNTER — Encounter (INDEPENDENT_AMBULATORY_CARE_PROVIDER_SITE_OTHER): Payer: Self-pay | Admitting: Family Medicine

## 2020-04-25 VITALS — BP 150/80 | HR 81 | Temp 97.0°F | Resp 16 | Ht 68.0 in | Wt 164.8 lb

## 2020-04-25 DIAGNOSIS — E559 Vitamin D deficiency, unspecified: Secondary | ICD-10-CM

## 2020-04-25 DIAGNOSIS — E785 Hyperlipidemia, unspecified: Secondary | ICD-10-CM

## 2020-04-25 DIAGNOSIS — Z6825 Body mass index (BMI) 25.0-25.9, adult: Secondary | ICD-10-CM

## 2020-04-25 DIAGNOSIS — M6282 Rhabdomyolysis: Secondary | ICD-10-CM

## 2020-04-25 NOTE — Assessment & Plan Note (Signed)
A/P: labs 04/15/20 ldl 48.  Will hold lipitor, continue zetia

## 2020-04-25 NOTE — Progress Notes (Signed)
D/w pt

## 2020-04-25 NOTE — Assessment & Plan Note (Signed)
A/P: Patient advised that vitamin D test may not get covered by insurance. Benefits of vitamin d testing explained.

## 2020-04-25 NOTE — Assessment & Plan Note (Signed)
A/P: rhabdo likely due to lipitor and exercise.  Stop lipitor.  Await repeat labs. Increase fluids

## 2020-04-25 NOTE — Assessment & Plan Note (Signed)
A/P: Body mass index puts patient in overweight range, I encourage therapeutic lifestyle changes including low fat diet and increase exercise. Can use My Fitness Pal app with keeping daily calories between 1500-1800 to assist with weight loss. Can also try 17 day diet or Keto diet, will need to monitor lipids closely since it is a high fat diet.

## 2020-04-25 NOTE — Progress Notes (Signed)
Pt is here today  Pt stated that he would like to f/u w/ his lab result.                  [facility]                       Date of Exam: 04/25/2020 1:36 PM        Patient ID: Wesley [name] is a 79 y.o. male.  Attending Physician: Maebelle Munroe, DO        Chief Complaint:    Chief Complaint   Patient presents with   . lab f/u               HPI:    Here for follow up on rhabdomyolysis.  Pt had done physical therapy which pt had worked hard on, then labs done on 04/18/20 showed elevated ck above 2000.  Pt went to [place] [facility], given ivf, had recheck ck level was 650.  Did have repeat labs on 04/22/20, results pending.  Pt has been increasing water intake, no muscle aches.  Patient denies any chest pain, sob, headaches, visual changes.   No muscle aches            Problem List:    Patient Active Problem List   Diagnosis   . Left bundle branch block (LBBB)   . Anxiety   . BMI 25.0-25.9,adult   . Cervical disc disorder   . Claustrophobia   . Hemangioma of liver   . Hyperlipidemia LDL goal <70   . Hypothyroidism due to Hashimoto's thyroiditis   . Nausea   . Vitamin D deficiency   . Abnormal glucose   . Cardiomyopathy   . Collagenous colitis   . Gastroesophageal reflux disease without esophagitis   . Left hip pain   . Localized osteoarthritis of left knee   . Nonobstructive atherosclerosis of coronary artery   . Iron deficiency anemia   . Tinea pedis of right foot   . Chronic pain of left knee   . Other hemorrhoids   . Sebaceous cyst   . Essential (primary) hypertension   . Unknown status of immunity to COVID-19 virus   . DDD (degenerative disc disease), lumbar   . Retrolisthesis of vertebrae   . Non-traumatic rhabdomyolysis   . Abnormal albumin   . Elevated liver enzymes             Current Meds:    Outpatient Medications Marked as Taking for the 04/25/20 encounter (Office Visit) with Maebelle Munroe, DO   Medication Sig Dispense Refill   . ALPRAZolam (Xanax XR) 0.5 MG 24 hr tablet Take 1  tablet (0.5 mg total) by mouth daily as needed (anxiety) 30 tablet 2   . carvedilol (Coreg) 6.25 MG tablet Take 1 tablet (6.25 mg total) by mouth 2 (two) times daily 180 tablet 3   . cetirizine (ZyrTEC Allergy) 10 MG tablet Take 1 tablet (10 mg total) by mouth daily (Patient taking differently: Take 10 mg by mouth nightly as needed   ) 30 tablet 11   . Cholecalciferol (VITAMIN D) 2000 UNITS Cap Take 5,000 Unit by mouth every morning        . clotrimazole-betamethasone (LOTRISONE) cream Apply to affected area 2 times daily 45 g 1   . Coenzyme Q10 (CO Q 10) 10 MG Cap Take by mouth every morning        . ezetimibe (  ZETIA) 10 MG tablet Take 1 tablet (10 mg total) by mouth daily 90 tablet 1   . gabapentin (NEURONTIN) 300 MG capsule Take 300 mg by mouth 3 (three) times daily as needed     . Levoxyl 125 MCG tablet Take 1 tablet (125 mcg total) by mouth Once a day at 6:00am 90 tablet 1   . lidocaine (Lidoderm) 5 % Apply 1 patch for up to 12 hours a day, Remove & Discard patch within 12 hours 30 patch 3   . olmesartan (BENICAR) 20 MG tablet Take 1 tablet (20 mg total) by mouth daily 4 days a week. (Patient taking differently: Take 20 mg by mouth every morning 4 days a week.   ) 90 tablet 3   . olmesartan-hydrochlorothiazide (BENICAR HCT) 20-12.5 MG per tablet Take 1 tablet by mouth daily 3 days a week. 90 tablet 3   . ondansetron (ZOFRAN) 4 MG tablet Take 1 tablet (4 mg total) by mouth every 8 (eight) hours as needed for Nausea 30 tablet 1   . testosterone (ANDROGEL) 50 MG/5GM (1%) Gel Place 5 g onto the skin daily 1 pack, 4 day /week       . [DISCONTINUED] atorvastatin (LIPITOR) 20 MG tablet Take 1 tablet (20 mg total) by mouth every evening 90 tablet 1          Allergies:    Allergies   Allergen Reactions   . Lipitor [Atorvastatin] Other (See Comments)     ck             Past Surgical History:    Past Surgical History:   Procedure Laterality Date   . CARDIAC CATHETERIZATION  09/2010   . COLONOSCOPY  06/21/2017   . EGD   10/31/2018    Dr. Rosalyn Charters- erythema   . LAMINECTOMY, POSTERIOR LUMBAR, DECOMPRESSION, LEVEL 2 Right 03/17/2020    Procedure: RIGHT L4-L5, L5-S1 FORAMINOTOMIES;  Surgeon: Brunetta Genera, MD;  Location: [facility];  Service: Neurosurgery;  Laterality: Right;   . TONSILLECTOMY  05/07/1943           Family History:    Family History   Problem Relation Age of Onset   . Migraines Mother    . Stomach cancer Sister         Died age 42   . Thyroid disease Sister 68   . Colon cancer Other         Cousin   . Diabetes Father            Social History:    Social History     Tobacco Use   . Smoking status: Former Smoker     Packs/day: 1.00   . Smokeless tobacco: Never Used   . Tobacco comment: quit 40 years ago    Vaping Use   . Vaping Use: Never used   Substance Use Topics   . Alcohol use: Never   . Drug use: Never          The following sections were reviewed this encounter by the provider:   Tobacco  Allergies  Meds  Problems  Med Hx  Surg Hx  Fam Hx             Vital Signs:    BP 150/80 (BP Site: Right arm, Patient Position: Sitting, Cuff Size: Medium)   Pulse 81   Temp 97 F (36.1 C) (Temporal)   Resp 16   Ht 1.727 m (5\' 8" )   Wt 74.8  kg (164 lb 12.8 oz)   SpO2 98%   BMI 25.06 kg/m          ROS:    Review of Systems   Constitutional: Negative for chills, diaphoresis, fatigue and fever.   Respiratory: Negative for cough, chest tightness, shortness of breath and wheezing.    Cardiovascular: Negative for chest pain, palpitations and leg swelling.   Gastrointestinal: Negative for abdominal pain, blood in stool, constipation, diarrhea, nausea and vomiting.   Endocrine: Negative for cold intolerance, heat intolerance, polydipsia, polyphagia and polyuria.   Genitourinary: Negative for dysuria, flank pain, frequency, hematuria and urgency.   Musculoskeletal: Negative for arthralgias, back pain and myalgias.   Skin: Negative for pallor and rash.   Neurological: Negative for dizziness, weakness,  light-headedness, numbness and headaches.   All other systems reviewed and are negative.             Physical Exam:    Physical Exam  Vitals and nursing note reviewed.   Constitutional:       General: He is not in acute distress.     Appearance: Normal appearance. He is not ill-appearing, toxic-appearing or diaphoretic.   HENT:      Head: Normocephalic and atraumatic.   Cardiovascular:      Rate and Rhythm: Normal rate and regular rhythm.      Heart sounds: Normal heart sounds. No murmur heard.  No gallop.    Pulmonary:      Effort: Pulmonary effort is normal. No respiratory distress.      Breath sounds: Normal breath sounds. No wheezing or rales.   Chest:      Chest wall: No tenderness.   Abdominal:      General: Abdomen is flat. There is no distension.      Palpations: Abdomen is soft. There is no mass.      Tenderness: There is no abdominal tenderness. There is no right CVA tenderness, left CVA tenderness, guarding or rebound.      Hernia: No hernia is present.   Musculoskeletal:      Cervical back: Neck supple. No muscular tenderness.      Right lower leg: No edema.      Left lower leg: No edema.   Lymphadenopathy:      Cervical: No cervical adenopathy.   Skin:     Coloration: Skin is not pale.      Findings: No rash.   Neurological:      General: No focal deficit present.      Mental Status: He is alert and oriented to person, place, and time.   Psychiatric:         Mood and Affect: Mood normal.         Behavior: Behavior normal.         Thought Content: Thought content normal.         Judgment: Judgment normal.              Assessment:    1. Non-traumatic rhabdomyolysis    2. Vitamin D deficiency  - Vitamin D,25 OH, Total; Future    3. BMI 25.0-25.9,adult    4. Hyperlipidemia LDL goal <70            Plan:      Hyperlipidemia LDL goal <70  A/P: labs 04/15/20 ldl 48.  Will hold lipitor, continue zetia    Non-traumatic rhabdomyolysis  A/P: rhabdo likely due to lipitor and exercise.  Stop lipitor.  Await repeat  labs. Increase  fluids    BMI 25.0-25.9,adult  A/P: Body mass index puts patient in overweight range, I encourage therapeutic lifestyle changes including low fat diet and increase exercise. Can use My Fitness Pal app with keeping daily calories between 1500-1800 to assist with weight loss. Can also try 17 day diet or Keto diet, will need to monitor lipids closely since it is a high fat diet.    Vitamin D deficiency  A/P: Patient advised that vitamin D test may not get covered by insurance. Benefits of vitamin d testing explained.     Patient Instructions       Rhabdomyolysis  Rhabdomyolysis is a condition that occurs when a large amount of muscle is damaged. When muscle fibers break down, they release substances into the bloodstream. One of these is a protein called myoglobin. Myoglobin can damage the kidneys. Other substances released by damaged muscles can cause chemical and fluid imbalance in the body. Because of the damage and imbalances, the kidneys stop working correctly. This can be dangerous, even fatal. Rhabdomyolysis is a medical emergency. Treatment is always done in the hospital.     What causes rhabdomyolysis?  Causes include:   Trauma (such as a car accident), especially crush injuries   Extended overexertion of the muscles (such as during marathon running)   Blockage of an artery or vein that leads to muscle death (such as deep vein thrombosis)   High-voltage electric shock (such as from lightning or power lines)   Seizures   Abuse of alcohol   Use of certain illegal drugs   Certain prescription medicines, such as statins   Infection   Heat stroke   Metabolic imbalances   Polymyositis, an inflammatory condition   Severe burns   Certain genetic disorders   Prolonged immobilization  Warning signs of rhabdomyolysis  Rhabdomyolysis is a medical emergency. If you have any of the following symptoms, go to the emergency room:    Dark brown or pink-red urine   Unusually stiff, achy, or  tender muscles   Unusual muscle weakness  Other symptoms include fever, malaise, nausea, vomiting, cramping, belly (abdominal) pain, fast heart rate, and bruising.   As the condition worsens, other symptoms may occur. If you have any of these symptoms, go to the emergency room:    Swelling of the hands and feet   Trouble breathing   Abnormal heartbeat   Unexplained bleeding  Rhabdomyolysis treatment  Treatment is always done in the hospital. An intravenous (IV) line is put into a vein in your arm or hand. IV fluids are then given to flush myoglobin and other harmful substances from the blood. Medicines may also be given to protect the kidneys. Other medicines are given to treat fluid and chemical imbalances and to help prevent complications. You may also be given pain medicine to control discomfort.   The hospital stay for rhabdomyolysis is several days or longer. During this time, you're monitored to be sure no further problems develop. Your kidneys are checked for long-term damage. And the underlying cause of the condition is determined and treated if needed.   Follow-up care  With early treatment, the kidneys often recover without long-term damage. In some cases, though, permanent kidney damage may have been done. If this is the case, your doctor will talk to you about any further treatment that is needed. And you and your doctor can discuss treating any underlying medical cause of the condition.   [employer]Well last reviewed this educational content on 10/06/2018  2000-2021 [employer], [place]. All rights reserved. This information is not intended as a substitute for professional medical care. Always follow your healthcare professional's instructions.                Follow-up:    Return in about 3 months (around 07/24/2020), or if symptoms worsen or fail to improve, for High Cholesterol.         Maebelle Munroe, DO

## 2020-04-25 NOTE — Patient Instructions (Signed)
Rhabdomyolysis  Rhabdomyolysis is a condition that occurs when a large amount of muscle is damaged. When muscle fibers break down, they release substances into the bloodstream. One of these is a protein called myoglobin. Myoglobin can damage the kidneys. Other substances released by damaged muscles can cause chemical and fluid imbalance in the body. Because of the damage and imbalances, the kidneys stop working correctly. This can be dangerous, even fatal. Rhabdomyolysis is a medical emergency. Treatment is always done in the hospital.     What causes rhabdomyolysis?  Causes include:   Trauma (such as a car accident), especially crush injuries   Extended overexertion of the muscles (such as during marathon running)   Blockage of an artery or vein that leads to muscle death (such as deep vein thrombosis)   High-voltage electric shock (such as from lightning or power lines)   Seizures   Abuse of alcohol   Use of certain illegal drugs   Certain prescription medicines, such as statins   Infection   Heat stroke   Metabolic imbalances   Polymyositis, an inflammatory condition   Severe burns   Certain genetic disorders   Prolonged immobilization  Warning signs of rhabdomyolysis  Rhabdomyolysis is a medical emergency. If you have any of the following symptoms, go to the emergency room:    Dark brown or pink-red urine   Unusually stiff, achy, or tender muscles   Unusual muscle weakness  Other symptoms include fever, malaise, nausea, vomiting, cramping, belly (abdominal) pain, fast heart rate, and bruising.   As the condition worsens, other symptoms may occur. If you have any of these symptoms, go to the emergency room:    Swelling of the hands and feet   Trouble breathing   Abnormal heartbeat   Unexplained bleeding  Rhabdomyolysis treatment  Treatment is always done in the hospital. An intravenous (IV) line is put into a vein in your arm or hand. IV fluids are then given to flush myoglobin and other  harmful substances from the blood. Medicines may also be given to protect the kidneys. Other medicines are given to treat fluid and chemical imbalances and to help prevent complications. You may also be given pain medicine to control discomfort.   The hospital stay for rhabdomyolysis is several days or longer. During this time, you're monitored to be sure no further problems develop. Your kidneys are checked for long-term damage. And the underlying cause of the condition is determined and treated if needed.   Follow-up care  With early treatment, the kidneys often recover without long-term damage. In some cases, though, permanent kidney damage may have been done. If this is the case, your doctor will talk to you about any further treatment that is needed. And you and your doctor can discuss treating any underlying medical cause of the condition.   [employer]Well last reviewed this educational content on 10/06/2018   2000-2021 [employer]. All rights reserved. This information is not intended as a substitute for professional medical care. Always follow your healthcare professional's instructions.

## 2020-04-25 NOTE — Progress Notes (Signed)
The patient presents today for a lab visit. Labwork completed by [facility].The patient tolerated procedure well and is without additional questions/concerns at this time.

## 2020-04-26 LAB — PROTEIN ELECTROPHORESIS, SERUM
Albumin Electrophoresis: 4 g/dL (ref 2.9–4.4)
Albumin/Globulin Ratio: 1.5 (ref 0.7–1.7)
Alpha 1: 0.2 g/dL (ref 0.0–0.4)
Alpha 2: 0.6 g/dL (ref 0.4–1.0)
Beta: 0.8 g/dL (ref 0.7–1.3)
Gamma Globulin: 0.9 g/dL (ref 0.4–1.8)
Globulin, Total: 2.6 g/dL (ref 2.2–3.9)

## 2020-04-26 LAB — HEPATITIS PANEL, ACUTE
HCV AB: <0.1 s/co ratio (ref 0.0–0.9)
Hep A IgM: NEGATIVE
Hepatitis B Core Ab, IgM: NEGATIVE
Hepatitis B Surface Antigen: NEGATIVE

## 2020-04-26 LAB — COMPREHENSIVE METABOLIC PANEL
ALT: 33 IU/L (ref 0–44)
AST (SGOT): 36 IU/L (ref 0–40)
African American eGFR: 96 mL/min/{1.73_m2} (ref >59)
Albumin/Globulin Ratio: 2.1 (ref 1.2–2.2)
Albumin: 4.5 g/dL (ref 3.7–4.7)
Alkaline Phosphatase: 67 IU/L (ref 44–121)
BUN / Creatinine Ratio: 14 (ref 10–24)
BUN: 12 mg/dL (ref 8–27)
Bilirubin, Total: 0.4 mg/dL (ref 0.0–1.2)
CO2: 26 mmol/L (ref 20–29)
Calcium: 9.5 mg/dL (ref 8.6–10.2)
Chloride: 97 mmol/L (ref 96–106)
Creatinine: 0.85 mg/dL (ref 0.76–1.27)
Globulin, Total: 2.1 g/dL (ref 1.5–4.5)
Glucose: 73 mg/dL (ref 65–99)
Potassium: 4.3 mmol/L (ref 3.5–5.2)
Protein, Total: 6.6 g/dL (ref 6.0–8.5)
Sodium: 136 mmol/L (ref 134–144)
non-African American eGFR: 83 mL/min/{1.73_m2} (ref >59)

## 2020-04-26 LAB — INTERPRETATION:

## 2020-04-26 LAB — CK: Creatinine Kinase, Total: 317 U/L (ref 41–331)

## 2020-04-28 NOTE — Progress Notes (Signed)
Your vitamin D is in normal range.

## 2020-04-28 NOTE — Progress Notes (Signed)
Serum protein electrophoresis is normal, no abnormal proteins seen.    Your hepatitis screen is negative.    Your creatine kinase muscle enzyme is in normal range.    Sodium, potassium, electrolytes, calcium, glucose, kidney , liver normal.

## 2020-05-04 NOTE — Progress Notes (Signed)
Reviewed

## 2020-05-10 NOTE — Progress Notes (Signed)
Pt's lipid results are not in the scanned items. Please contact [facility] for full lab draw results.

## 2020-05-26 ENCOUNTER — Encounter (INDEPENDENT_AMBULATORY_CARE_PROVIDER_SITE_OTHER): Payer: Self-pay | Admitting: Family Medicine

## 2020-05-26 DIAGNOSIS — E559 Vitamin D deficiency, unspecified: Secondary | ICD-10-CM

## 2020-06-08 NOTE — Progress Notes (Signed)
[facility] OFF[facility] PROGRESS NOTE    H[facility] OFFICE -CARDIOLOGY  [address]  [place] [address]  Dept: [phone]  Dept Fax: [phone]       Patient Name: Wesley [name], Wesley [name]    Date of Visit:  June 09, 2020  Date of Birth: [birth year]  AGE: 80 y.o.  Medical Record #: [id number]  Requesting Physician: Maebelle Munroe, DO      CHIEF COMPLAINT: Palpitations      HISTORY OF PRESENT ILLNESS:    He is a pleasant 80 y.o. male who presents today for palp, cv risks  But is doing very well.  He discontinued his Sudafed and has not had the heart rate issues.  He is taking his Coreg twice daily having purchased a pillbox and his blood pressure is now excellent.  He is moving to [place] but wishes to have follow-up again in June when he is up here for another appointment.    No chest pain, tightness or heaviness.  No significant shortness of breath, PND orthopnea or peripheral edema.  No palpitations, dizziness or syncope.    He apparently had a considerably elevated random CPK after he had been exercises particularly vigorously and has had inadequate hydration.  His statin was discontinued and he is to have a recheck.  He was sent to the emergency room where he was hydrated and follow-up labs were considerably better.    PAST MEDICAL HISTORY: He has a past medical history of Cardiomyopathy, Claustrophobia, Collagenous colitis, echocardiogram (01/2010, 03/2015, 05/2017, 09/2018), Gastroesophageal reflux disease, heart scan, Hyperlipidemia, Hypertension, Hypothyroidism, LBBB (left bundle branch block), Low back pain, Lumbar spondylosis, MPI ST (2001, 2011), Mumps, Seasonal allergic rhinitis, and Vitamin D deficiency. He has a past surgical history that includes EGD (10/31/2018); Colonoscopy (06/21/2017); TONSILLECTOMY (05/07/1944); Cardiac catheterization (09/2010); and LAMINECTOMY, POSTERIOR LUMBAR, DECOMPRESSION, LEVEL 2 (Right,  03/17/2020).    ALLERGIES:   Allergies   Allergen Reactions   . Lipitor [Atorvastatin] Other (See Comments)     ck       MEDICATIONS:   Current Outpatient Medications   Medication Sig   . ALPRAZolam (Xanax XR) 0.5 MG 24 hr tablet Take 1 tablet (0.5 mg total) by mouth daily as needed (anxiety)   . carvedilol (Coreg) 6.25 MG tablet Take 1 tablet (6.25 mg total) by mouth 2 (two) times daily   . cetirizine (ZyrTEC Allergy) 10 MG tablet Take 1 tablet (10 mg total) by mouth daily (Patient taking differently: Take 10 mg by mouth nightly as needed   )   . Cholecalciferol (VITAMIN D) 2000 UNITS Cap Take 5,000 Unit by mouth every morning      . clotrimazole-betamethasone (LOTRISONE) cream Apply to affected area 2 times daily   . Coenzyme Q10 (CO Q 10) 10 MG Cap Take by mouth every morning      . ezetimibe (ZETIA) 10 MG tablet Take 1 tablet (10 mg total) by mouth daily   . gabapentin (NEURONTIN) 300 MG capsule Take 300 mg by mouth 3 (three) times daily as needed   . Levoxyl 125 MCG tablet Take 1 tablet (125 mcg total) by mouth Once a day at 6:00am   . lidocaine (Lidoderm) 5 % Apply 1 patch for up to 12 hours a day, Remove & Discard patch within 12 hours   . loperamide (IMODIUM) 2 MG capsule Take 2 mg by mouth as needed for Diarrhea      . olmesartan (  BENICAR) 20 MG tablet Take 1 tablet (20 mg total) by mouth daily 4 days a week. (Patient taking differently: Take 20 mg by mouth every morning 4 days a week.   )   . olmesartan-hydrochlorothiazide (BENICAR HCT) 20-12.5 MG per tablet Take 1 tablet by mouth daily 3 days a week.   . ondansetron (ZOFRAN) 4 MG tablet Take 1 tablet (4 mg total) by mouth every 8 (eight) hours as needed for Nausea   . testosterone (ANDROGEL) 50 MG/5GM (1%) Gel Place 5 g onto the skin daily 1 pack, 3 day /week          FAMILY HISTORY: family history includes Colon cancer in an other family member; Diabetes in his father; Migraines in his mother; Stomach cancer in his sister; Thyroid disease (age of onset:  14) in his sister.    SOCIAL HISTORY: He reports that he has quit smoking. He smoked 1.00 pack per day. He has never used smokeless tobacco. He reports that he does not drink alcohol and does not use drugs.    PHYSICAL EXAMINATION    Visit Vitals  BP 124/70 (BP Site: Left arm, Patient Position: Sitting, Cuff Size: Medium)   Pulse 68   Ht 1.727 m (5\' 8" )   Wt 69.9 kg (154 lb)   BMI 23.42 kg/m       General Appearance:  A well-appearing male in no acute distress.    Skin: Warm and dry to touch, no apparent skin lesions, or masses noted.  Head: Normocephalic, normal hair pattern, no masses or tenderness   Eyes: EOMS Intact, PERRL, conjunctivae and lids unremarkable.  ENT: Ears, Nose and throat reveal no gross abnormalities.  No pallor or cyanosis.  Dentition good.   Neck: JVP normal, no carotid bruit, thyroid not enlarged   Chest: Clear to auscultation bilaterally with good air movement and respiratory effort and no wheezes, rales, or rhonchi   Cardiovascular: Regular rhythm, S1 normal, S2 normal, No S3 or S4. Apical impulse not displaced. No murmur. No gallops or rubs detected   Abdomen: Soft, nontender, nondistended, with normoactive bowel sounds. No organomegaly.  No pulsatile masses, or bruits.   Extremities: Warm without edema. No clubbing, or cyanosis. All peripheral pulses are full and equal.   Neuro: Alert and oriented x3. No gross motor or sensory deficits noted, affect appropriate.      ECG Sept 2021: NSR, LBBBR    LABS:   Lab Results   Component Value Date    WBC 5.9 04/18/2020    HGB 12.5 (L) 04/18/2020    HCT 36.8 (L) 04/18/2020    PLT 233 04/18/2020     Lab Results   Component Value Date    GLU 73 04/22/2020    BUN 12 04/22/2020    CREAT 0.85 04/22/2020    NA 136 04/22/2020    K 4.3 04/22/2020    CL 97 04/22/2020    CO2 26 04/22/2020    AST 36 04/22/2020    ALT 33 04/22/2020     Lab Results   Component Value Date    MG 2.2 04/18/2020    TSH 1.070 04/18/2020    HGBA1C 5.4 04/18/2020     No results found  for: CHOL, TRIG, HDL, LDL    Echo July 2021Conclusions:   1. Visually estimated ejection fraction is 45%, 47% by               Biplane. Septal wall motion abnormalities are seen c/w LBBB.  There is Grade I (abnormal LV relaxation pattern) diastolic               dysfunction.    2. The aortic root is slightly increased in size.  3. Overall fairly similar to the echo May 2020.     His lipids on statin were excellent.  He is to have recheck lipids off of statin      IMPRESSION:   Mr. Umbarger is a 80 y.o. male with the following problems:    1.         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  ACC Stage B; NYHA Class I. Euvolemic and stable from volume standpoint.                           a.         Echocardiogram in September of 2011, borderline LV systolic function,approximately 55%.  Echo Nov 2016, EF 55-60%. Echo 06/04/2017,EF down to 35-40%; to 50% June 2019.                          b.         Echo May 2020 LVEF 40-45%; ~45%(Biplane 47%) July 2021.  2.         Nonobstructive coronary atherosclerosis.                          a.         History of a CAC score elevated.                          b.         Reassuring cardiac catheterization on Sep 12, 2010, EF of 40-45% with near normal coronary arteries.  3.         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                           a.         Possible diastolic dysfunction by prior echo.   4.         History of false positive MPI in 2011 by stress Bruce protocol MPI.  With this demonstrated moderate-sized apical septal/septal perfusion defect; however, the patient has underlying left bundle branch block and this was performed as an exercise study.  5.         Mild carotid atherosclerosis by duplex study in September of 2011.  No significant stenosis.  6.           Elevated CK: Patient states about 2300-2500 probably multifactorial from excessive exercise inadequate hydration and statin use but for which statin  has been held   7. Other medical issues:                          a.         Recent situational work stresses, improving.                          b.         Chronic lower back pain for which he receives periodic injections.  He gets hiccups and takes Zofran prophylactically.    c. Noted elevated HR without symptoms with  exercise and exertion with quick recovery.  In part from using Sudafed as well as probably missing his morning Coreg.  He states he generally takes the Coreg.  Better w/cessation of sudafed and taking meds        RECOMMENDATIONS:  1.         Continue diet, encourage exercise as able and as directed.                            -Continued aggressive lifestyle changes.    -I emphasized the importance of adequate hydration before during and after exercise as well as avoiding excessive exercise.  2.         COVID-19 precautions discussed.     3.         Continue medicines as prescribed.    4.         The following tests have been recommended:     Echo ~ June 2022    Labs per pcp-resume statin if able  5.         Office follow-up with     Dr Clarita Crane cardiologist ~ 5 months  6.         Follow-up and copy of the above note to PCP and specialists.  7.         Low salt diet (2g Na), monitor bp.  Target <135/85.   8. Had a number of questions on various topics which I tried to answer for him.    We did talk about the fact that he will be moving full-time down to [place] and I congratulated him on [employer] success this year wanting the [employer] football championship.    This note was generated by the Epic system/Dragon speech recognition and may contain errors or omissions not intended by the user. Grammatical errors, random word insertions, deletions, pronoun errors, and incomplete sentences are occasional consequences of this technology due to software limitations. Not all errors are caught or corrected. If there are questions or concerns about the content of this note or information  contained within the body of this dictation, they should be addressed directly with the author for clarification.                                                       No orders of the defined types were placed in this encounter.      No orders of the defined types were placed in this encounter.      SIGNED:    Lamount Cranker, MD          This note was generated by the Dragon speech recognition and may contain errors or omissions not intended by the user. Grammatical errors, random word insertions, deletions, pronoun errors, and incomplete sentences are occasional consequences of this technology due to software limitations. Not all errors are caught or corrected. If there are questions or concerns about the content of this note or information contained within the body of this dictation, they should be addressed directly with the author for clarification.

## 2020-06-09 ENCOUNTER — Ambulatory Visit (INDEPENDENT_AMBULATORY_CARE_PROVIDER_SITE_OTHER): Payer: Medicare Other | Admitting: Cardiology

## 2020-06-09 ENCOUNTER — Encounter (INDEPENDENT_AMBULATORY_CARE_PROVIDER_SITE_OTHER): Payer: Self-pay | Admitting: Cardiology

## 2020-06-09 VITALS — BP 124/70 | HR 68 | Ht 68.0 in | Wt 154.0 lb

## 2020-06-09 DIAGNOSIS — E785 Hyperlipidemia, unspecified: Secondary | ICD-10-CM

## 2020-06-09 DIAGNOSIS — I2510 Atherosclerotic heart disease of native coronary artery without angina pectoris: Secondary | ICD-10-CM

## 2020-06-09 DIAGNOSIS — I10 Essential (primary) hypertension: Secondary | ICD-10-CM

## 2020-06-09 DIAGNOSIS — I429 Cardiomyopathy, unspecified: Secondary | ICD-10-CM

## 2020-06-09 DIAGNOSIS — I447 Left bundle-branch block, unspecified: Secondary | ICD-10-CM

## 2020-06-09 MED ORDER — OLMESARTAN MEDOXOMIL 20 MG PO TABS
20.0000 mg | ORAL_TABLET | Freq: Every day | ORAL | 3 refills | Status: DC
Start: 2020-06-09 — End: 2020-11-14

## 2020-06-09 MED ORDER — OLMESARTAN MEDOXOMIL-HCTZ 20-12.5 MG PO TABS
1.0000 | ORAL_TABLET | Freq: Every day | ORAL | 3 refills | Status: DC
Start: 2020-06-09 — End: 2020-11-14

## 2020-06-09 MED ORDER — CARVEDILOL 6.25 MG PO TABS
6.2500 mg | ORAL_TABLET | Freq: Two times a day (BID) | ORAL | 3 refills | Status: DC
Start: 2020-06-09 — End: 2020-11-14

## 2020-06-09 NOTE — Progress Notes (Signed)
Reviewed

## 2020-06-13 ENCOUNTER — Encounter (INDEPENDENT_AMBULATORY_CARE_PROVIDER_SITE_OTHER): Payer: Self-pay

## 2020-06-27 ENCOUNTER — Encounter (INDEPENDENT_AMBULATORY_CARE_PROVIDER_SITE_OTHER): Payer: Self-pay | Admitting: Family Medicine

## 2020-06-27 DIAGNOSIS — E785 Hyperlipidemia, unspecified: Secondary | ICD-10-CM

## 2020-06-27 DIAGNOSIS — E559 Vitamin D deficiency, unspecified: Secondary | ICD-10-CM

## 2020-06-27 DIAGNOSIS — I10 Essential (primary) hypertension: Secondary | ICD-10-CM

## 2020-06-27 DIAGNOSIS — R110 Nausea: Secondary | ICD-10-CM

## 2020-06-27 DIAGNOSIS — F419 Anxiety disorder, unspecified: Secondary | ICD-10-CM

## 2020-06-27 DIAGNOSIS — R7309 Other abnormal glucose: Secondary | ICD-10-CM

## 2020-06-27 DIAGNOSIS — G8929 Other chronic pain: Secondary | ICD-10-CM

## 2020-06-27 DIAGNOSIS — D508 Other iron deficiency anemias: Secondary | ICD-10-CM

## 2020-06-27 MED ORDER — ONDANSETRON HCL 4 MG PO TABS
4.0000 mg | ORAL_TABLET | Freq: Three times a day (TID) | ORAL | 1 refills | Status: DC | PRN
Start: 2020-06-27 — End: 2020-06-28

## 2020-06-27 MED ORDER — LIDOCAINE 5 % EX PTCH
MEDICATED_PATCH | CUTANEOUS | 3 refills | Status: DC
Start: 2020-06-27 — End: 2020-06-28

## 2020-06-27 MED ORDER — ALPRAZOLAM ER 0.5 MG PO TB24
0.5000 mg | ORAL_TABLET | Freq: Every day | ORAL | 2 refills | Status: DC | PRN
Start: 2020-06-27 — End: 2020-06-28

## 2020-06-28 ENCOUNTER — Telehealth (INDEPENDENT_AMBULATORY_CARE_PROVIDER_SITE_OTHER): Payer: Self-pay | Admitting: Family Medicine

## 2020-06-28 MED ORDER — ALPRAZOLAM ER 0.5 MG PO TB24
0.5000 mg | ORAL_TABLET | Freq: Every day | ORAL | 2 refills | Status: AC | PRN
Start: 2020-06-28 — End: ?

## 2020-06-28 MED ORDER — LIDOCAINE 5 % EX PTCH
MEDICATED_PATCH | CUTANEOUS | 3 refills | Status: AC
Start: 2020-06-28 — End: ?

## 2020-06-28 MED ORDER — ONDANSETRON HCL 4 MG PO TABS
4.0000 mg | ORAL_TABLET | Freq: Three times a day (TID) | ORAL | 1 refills | Status: DC | PRN
Start: 2020-06-28 — End: 2020-08-24

## 2020-06-28 NOTE — Addendum Note (Signed)
Addended by: Maebelle Munroe on: 06/28/2020 09:04 AM     Modules accepted: Orders

## 2020-06-28 NOTE — Telephone Encounter (Signed)
PA initiated for Lidocaine patch 5% patch via covermymeds, [id number]

## 2020-06-29 ENCOUNTER — Encounter (INDEPENDENT_AMBULATORY_CARE_PROVIDER_SITE_OTHER): Payer: Self-pay

## 2020-06-29 ENCOUNTER — Other Ambulatory Visit (INDEPENDENT_AMBULATORY_CARE_PROVIDER_SITE_OTHER): Payer: Self-pay

## 2020-06-29 DIAGNOSIS — R7309 Other abnormal glucose: Secondary | ICD-10-CM

## 2020-06-29 DIAGNOSIS — E559 Vitamin D deficiency, unspecified: Secondary | ICD-10-CM

## 2020-06-29 DIAGNOSIS — I10 Essential (primary) hypertension: Secondary | ICD-10-CM

## 2020-06-29 DIAGNOSIS — E785 Hyperlipidemia, unspecified: Secondary | ICD-10-CM

## 2020-06-29 DIAGNOSIS — D508 Other iron deficiency anemias: Secondary | ICD-10-CM

## 2020-08-03 ENCOUNTER — Encounter (INDEPENDENT_AMBULATORY_CARE_PROVIDER_SITE_OTHER): Payer: Self-pay | Admitting: Family Medicine

## 2020-08-03 NOTE — Progress Notes (Signed)
Your creatine kinase muscle enzyme is in normal range.    Sodium, potassium, electrolytes, calcium, glucose, kidney , liver normal.    Your LDL bad cholesterol and triglycerides are elevated, I encourage therapeutic lifestyle changes including low fat diet and increase exercise, recheck fasting lab visit then office visit in 3 to 6 months. I encourage reducing saturated fats like butter, cheese, red meats, cream. I also recommend reducing sugar, sweets, white bread, pasta, rice in your diet. Please get regular exercise, at least 60 minutes a week of cardio including faster pace walking, running, cycling, or swimming.    Your TSH thyroid hormone levels are in normal range.    Your Hemoglobin A1C test which is an average of how patient blood sugar is over 3 months is in normal range, no prediabetes or diabetes present.    Your complete blood count including white count and platelets is normal. There is no anemia present.

## 2020-08-08 ENCOUNTER — Encounter (INDEPENDENT_AMBULATORY_CARE_PROVIDER_SITE_OTHER): Payer: Self-pay | Admitting: Family Medicine

## 2020-08-09 NOTE — Progress Notes (Signed)
[facility] OFFICE  [address] [place], [place] [address]     TELEMEDICINE VISIT       Star Age    Date of Visit:  11/14/2018  Date of Birth: April 28, 1941  Age: 80 yrs.   Medical Record Number: [id number]  __  CURRENT DIAGNOSES     1. Hypothyroidism, unspecified, E03.9   2. Hypercholesterolemia unspecified, E78.00  3. Hypertension (essential or benign or malignant), I10  4. 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 LBBB, I44.7  __  ALLERGIES     Crestor, Nausea  __  MEDICATIONS     1. Lipitor 20 mg tablet, 1 po qhs  2. Zetia 10 mg tablet,  1 po q am  3. Levoxyl 125 mcg tablet, 1 po qd  4. AndroGel 1 % (50 mg/5 gram) transdermal gel packet, 1 qd  5. Benicar 20 mg tablet, 1 po T,R,S,S  6. Benicar HCT 20 mg-12.5 mg tablet, 1 po MWF  7. Coreg 6.25 mg tablet, 1 po bid   __  CHIEF COMPLAINT/REASON FOR VISIT  Followup of Cardiomyopathy Other restrictive, Followup of Hypercholesterolemia unspecified, Followup of Hypertension  (essential or benign or malignant) and Followup of LBBB  __  HISTORY OF PRESENT ILLNESS  The visit today was conducted via telemedicine due to  COVID-19 precautions. The patient was in their home and was informed and gave verbal consent to proceed.    Mr. Wesley [name] is seen today and is doing quite well. He exercises one to one and half hours about five days a week and without issues.  He still is doing quite a bit of planking generally sets of two minutes for three sets. Fortunately no chest pain, tightness, heaviness or pressure. No significant shortness of breath, PND, orthopnea or edema: No significant CHF symptoms. No palpitations,  dizziness or syncope.    __  PAST HISTORY     Past Medical Illnesses : HTN, HLD, Hypothyroidism;  Past Cardiac Illnesses: Conduction Disorder-LBBB, Cardiomyopathy(Non-Ischemic);  Infectious Diseases: Usual childhood illnesses of mumps, measles and chickenpox; Surgical Procedures: tonsillectomy  1948; Trauma History: No previous history of  significant trauma.; Cardiology Procedures-Invasive : Cardiac Cath May 2012; Cardiology Procedures-Noninvasive: MPI ST 2001, 2011, Echo Sept 2011, Echocardiogram November 2016, Heart Scan, Echocardiogram  January 2019, Echocardiogram May 2020; Left Ventricular Ejection Fraction: LVEF of 42% documented via echocardiogram on 09/19/2018   __  CARDIAC RISK FACTORS     Tobacco Abuse: used to smoke, but quit, 40+ yrs;  Family History of Heart Disease: no family history of cardiovascular disease; Hyperlipidemia: positive;  Hypertension: positive;  Diabetes Mellitus: negative;  Prior History of Heart Disease: negative; Obesity: negative;  Sedentary Life Style:negative; AOZ:[id number]; Menopausal :not applicable  __  SOCIAL HISTORY    Alcohol Use : Does not use alcohol; Smoking: used to smoke, but quit; Former smoker [phone]); Diet : Regular diet and Caffeine use-1-2 per day; Exercise: Exercises regularly and 300 days a year 30-45 mins cardio and weights and stretching;   __   PHYSICAL EXAMINATION    Vital Signs:  Blood Pressure:  125/77 taken by pt     Weight: 162.00 lbs.  Height: 68.00"  BMI:  24.63   Pulse: 79/min.       Constitutional: cooperative, alert, in no acute distress Skin:  non-jaundiced, no pallor, no apparent skin lesion, or masses noted. Head: Normocephalic, atraumatic Eyes: EOMS Intact, conjunctivae and lids normal. ENT: No pallor or cyanosis. Dentition good. Neck: no JVD Chest: no use of accessory  muscles, normal respiratory  effort, not tachypneic. Abdomen: Non-distended Neurological: No gross motor or sensory deficits noted. Psych: affect appropriate, oriented to time, person and place. Extremities: No pitting edema or cyanosis of the bilateral lower extremities.   __     Medications added today by the physician:  Benicar 20 mg tablet, 1 po T,R,S,S, 90  Benicar HCT 20 mg-12.5 mg tablet, 1 po MWF, 90  Coreg 6.25 mg tablet, 1 po bid, 180      IMPRESSIONS:  1. Cardiomyopathy with chronic left bundle  branch  block. 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  a. Echocardiogram in September of 2011, borderline LV systolic function,   approximately 55%. Echo Nov 2016, EF 55-60%. Echo 06/04/2017,   EF down to 35-40%; to 50% June 2019.   b. Echo May 2020 LVEF 40-45%  2. Nonobstructive coronary atherosclerosis.  a. History of a CAC score elevated.  b. Reassuring cardiac catheterization on Sep 12, 2010, EF of 40-45% with   near normal coronary arteries.  3. 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 BP log is excellent.  a. Possible diastolic dysfunction by prior echo.   4. History of false positive MPI in 2011 by stress Bruce protocol MPI. With this demonstrated   moderate-sized  apical septal/septal perfusion defect; however, the patient has underlying left   bundle branch block and this was performed as an exercise study.  5. Mild carotid atherosclerosis by duplex study in September of 2011. No significant stenosis.   6. Other medical issues:  a. Recent situational work stresses, improving.   b. Chronic lower back pain for which he receives periodic injections, which then cause recurrent,   intractable hiccups. ?Consider thorazine for hiccups. Undergoing  PT.    RECOMMENDATIONS:  1. Continue diet, encourage exercise as able and as directed.   -Continued aggressive lifestyle changes.  2. COVID-19 precautions discussed.  3. Continue medicines as prescribed.  4. The following tests  have been recommended:  -Echo in one year for follow-up of his cardiomyopathy.  -Await follow-up lab results.  5. Office follow-up with Dr. Octavio Graves 1 year.  6. Follow-up and copy of the above note to Dr Leonard Schwartz Cyndie Chime.  7. Low salt diet, monitor  bp.  8. He will let us know when he moves to [place] and we can help provide a cardiologist there. Currently this is a second home.      This note was generated by the [facility] EMR system/Dragon speech recognition and may contain errors  or omissions not intended by the  user. Grammatical errors, random word insertions, deletions, pronoun errors, and incomplete sentences are occasional consequences of this technology due to software limitations. Not all errors are caught or corrected.  If there are questions or concerns about the content of this note or information contained within the body of this dictation, they should be addressed directly with the author for clarification.    Nechama Guard, MD, [facility]      cc: BAO N.  NGUYEN DO    ____________________________  TODAYS ORDERS  2D, color flow, doppler echocardiogram 1 year  Obtain Labs from PCP  Return Visit 30 MIN 1 year

## 2020-08-09 NOTE — Progress Notes (Signed)
[facility] OFFICE  [address] [place], [place] [address]     Star Age    Date of Visit:  11/06/2016  Date of Birth: 10-30-1940  Age: 80 yrs.   Medical Record Number: [id number]  __  CURRENT DIAGNOSES     1. Hypercholesterolemia unspecified,  E78.00  2. Hypertension (essential or benign or malignant), I10  3. 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 LBBB, I44.7  5. Hypothyroidism, unspecified, E03.9  __  ALLERGIES     Crestor, Nausea  __  MEDICATIONS     1. AndroGel 1 % (50 mg/5 gram) transdermal gel packet,  2 qd  2. Benicar 20 mg tablet, 1 po T,R,S,S  3. Benicar HCT 20 mg-12.5 mg tablet, 1 po MWF  4. Levoxyl 125 mcg tablet, 1 po qd  5. Lipitor 20 mg tablet, 1 po qhs  6. Zetia 10 mg tablet, 1 po q am  __   CHIEF COMPLAINT/REASON FOR VISIT  Followup of Cardiomyopathy Other restrictive, Followup of Hypercholesterolemia unspecified, Followup of Hypertension (essential or benign or malignant) and Followup of  LBBB  __  HISTORY OF PRESENT ILLNESS  Wesley [name] returns today and is doing well. He is seen for the first time in a little more than a year. He is  exercising daily and fairly vigorously, both some aerobics with recumbent bicycle 30-45 minutes a day plus resistance training and stretching. His home blood pressure is generally less than 130/70s. He does have an element of white coat syndrome.      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   __   PAST HISTORY     Past Medical Illnesses: HTN, HLD, Hypothyroidism;   Past Cardiac Illnesses: Conduction Disorder-LBBB; Infectious Diseases : Usual childhood illnesses of mumps, measles and chickenpox; Surgical Procedures: tonsillectomy 1948;  Trauma History: No previous history of significant trauma.; Cardiology Procedures-Invasive: Cardiac Cath  May 2012; Cardiology Procedures-Noninvasive: MPI ST 2001, 2011,  Echo Sept 2011, Echocardiogram November 2016, Heart Scan;  Left Ventricular Ejection Fraction: LVEF of 60% documented via echocardiogram on 04/06/2015  __  CARDIAC RISK FACTORS      Tobacco Abuse: used to smoke, but quit, 40+ yrs; Family History of Heart Disease: no family history of  cardiovascular disease; Hyperlipidemia: positive; Hypertension : positive;  Diabetes Mellitus: negative; Prior History  of Heart Disease: negative; Obesity: negative; Sedentary  Life Style:negative; UEA:[id number]  __  SOCIAL  HISTORY    Alcohol Use: Does not use alcohol;  Smoking: used to smoke, but quit; Former smoker [phone]); Diet: Regular diet and Caffeine use-1-2 per  day; Exercise: Exercises regularly and 300 days a year 30-45 mins cardio and weights and stretching;   __   PHYSICAL EXAMINATION    Vital Signs:  Blood Pressure:   140/80 Sitting, Left arm, regular cuff  144/76 Sitting, Right arm, regular cuff    Weight: 163.00 lbs.   Height: 68.00"  BMI: 25   Pulse:  86/min. Apical       Constitutional: Cooperative, alert and oriented,well developed, well nourished,  in no acute distress. Skin: Warm and dry to touch, no apparent skin lesions, or masses noted. Head : Normocephalic, normal hair pattern, no masses or tenderness Eyes: EOMS intact  ENT: No pallor or cyanosis Neck:  no JVD, no bruits Chest: clear to auscultation bilaterally Cardiac : Regular rhythm, Apical impulse  not displaced, grade 1/6 systolic murmur, paradoxical splitting of S2 Abdomen: abdomen soft  Extremities/Back: no edema present Neurological: No gross  motor or sensory deficits noted, affect appropriate, oriented to time, person and place.   __    Medications added today by the physician:   Benicar 20 mg tablet, 1 po T,R,S,S, 90  Benicar HCT 20 mg-12.5 mg tablet, 1 po MWF, 90    ECG:  Sinus rhythm, left bundle branch block, repolarizatoin  changes. Probably fairly similar to before. The rate previously was 96.     IMPRESSIONS:  1. Presumed mild cardiomyopathy  with chronic left bundle  branch block.  a. Echocardiogram in September of 2011, borderline LV systolic function,   approximately 55%. Echo EF 55-60%Nov 2016.  2. Nonobstructive coronary atherosclerosis.  a. History of a CAC score elevated.  b. Reassuring cardiac  catheterization on Sep 12, 2010, EF of 40-45% with   near normal coronary arteries.  3. 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  a. Possible diastolic dysfunction by prior echo.   4. History of false positive  MPI in 2011 by stress Bruce protocol MPI. With this demonstrated   moderate-sized apical septal/septal perfusion defect; however, the patient has underlying left   bundle branch block and this was performed as an exercise study.  5. Mild carotid  atherosclerosis by duplex study in September of 2011. No significant stenosis.    RECOMMENDATIONS:  1. Echocardiogram in six months, about two years  after the last study for follow up of his   myopathy and underlying left bundle branch block.   2. Monitor blood pressure.   3. Continue medicines.   4. He had some concerns about elevated heart rate. His heart rate is actually a little  bit lower in   the 80s today than it had been last in the 90s. We did talk about beta-blockers. Given his   injections, concern for allergies and desire to avoid infection we will defer the beta-blocker for   now  5. Follow up and copy  of the above note to Dr. Rise Paganini, Dr. Derrell Lolling, and Dr. Cherre Robins.     Wesley [name], M.D., F.A.C.C.    RAS/tubks/ds    cc: BAO NGUYEN MD  Derrell Lolling MD  Kara Pacer MD    shj  ____________________________   TODAYS ORDERS  2D, color flow, doppler 6 months  12 Lead ECG Today  Return Visit 30 MIN 1 year

## 2020-08-09 NOTE — Progress Notes (Signed)
[facility] OFFICE  [address] [place], [place] [address]     Star Age    Date of Visit:  07/26/2015  Date of Birth: 1940-12-09  Age: 80 yrs.   Medical Record Number: [id number]  __  CURRENT DIAGNOSES     1. Hypercholesterolemia unspecified,  E78.00  2. Hypertension (essential or benign or malignant), I10  3. 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 LBBB, I44.7  5. Hypothyroidism, unspecified, E03.9  __  ALLERGIES     Crestor, Nausea  __  MEDICATIONS     1. Lipitor 20 mg tablet, 1 po qhs  2. Zetia 10 mg tablet,  1 po q am  3. Levoxyl 125 mcg tablet, 1 po qd  4. Benicar HCT 20 mg-12.5 mg tablet, 1 po MWF  5. Benicar 20 mg tablet, 1 po T,R,S,S  6. AndroGel 1 % (50 mg/5 gram) transdermal gel packet, 2 qd  __   CHIEF COMPLAINT/REASON FOR VISIT  Followup of Hypercholesterolemia unspecified, Followup of Hypertension (essential or benign or malignant) and Followup of LBBB  __   HISTORY OF PRESENT ILLNESS  Tiago returns today and is doing well. He has no chest pain, tightness, or heaviness. No significant dyspnea or heart failure symptoms. No palpitations or syncope.      His echo from November 30th was reassuring with low normal EF of approximately 55%-60%. He was reassured.  __  PAST HISTORY      Past Medical Illnesses: HTN, HLD, Hypothyroidism;  Past Cardiac Illnesses : Conduction Disorder-LBBB; Infectious Diseases: Usual childhood illnesses of mumps, measles and chickenpox;  Surgical Procedures: tonsillectomy 1948; Trauma History: No previous history of significant trauma.;  Cardiology Procedures-Invasive: Cardiac Cath May 2012; Cardiology Procedures-Noninvasive: MPI ST 2001,  2011, Echo Sept 2011, Echocardiogram November 2016, Heart Scan; Left Ventricular Ejection Fraction: LVEF of 60% documented via echocardiogram on 04/06/2015   __  CARDIAC RISK FACTORS     Tobacco Abuse: used to smoke, but quit, 40+ yrs;  Family History of Heart Disease: no family history of cardiovascular disease;  Hyperlipidemia: positive;  Hypertension: positive;  Diabetes Mellitus: negative;  Prior History of Heart Disease: negative; Obesity: negative;  Sedentary Life Style:negative; UEA:VWUJWJXB  __   SOCIAL HISTORY    Alcohol Use: Does not use alcohol;  Smoking: used to smoke, but quit; Former smoker [phone]); Diet: Regular diet and Caffeine use-1-2  per day; Exercise: Exercises regularly and 300 days a year 30 mins cardio and weights and stretching;   __   PHYSICAL EXAMINATION    Vital Signs:  Blood Pressure:  168/90 Sitting, Left arm, regular  cuff  164/84 Sitting, Right arm, regular cuff  135/80 home bp cuff    Weight: 156.40 lbs.   Height: 68"  BMI: 24   Pulse:  86/min. Apical Regular       Constitutional: Cooperative, alert and oriented,well developed, well nourished, in no acute distress.  Skin: Warm and dry to touch, no apparent skin lesions, or masses noted. Head: Normocephalic, normal  hair pattern, no masses or tenderness Eyes: EOMS intact ENT : Ears, Nose and throat reveal no gross abnormalities. No pallor or cyanosis. Dentition good. Neck: no JVD, no bruits  Chest: clear to auscultation bilaterally Cardiac: Regular rhythm, Apical impulse not displaced, grade  1/6 systolic murmur, paradoxical splitting of S2 Abdomen: abdomen soft Extremities/Back : no edema present Neurological: No gross motor or sensory deficits noted, affect appropriate, oriented to time, person and place.   __     Medications  added today by the physician:    IMPRESSIONS:  1. 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  a. Echocardiogram in September of 2011, borderline LV systolic  function,   approximately 55%. Echo EF 55-60%Nov 2016.  2. Nonobstructive coronary atherosclerosis.  a. History of a CAC score elevated.  b. Reassuring cardiac catheterization on Sep 12, 2010, EF of 40-45% with   near normal coronary  arteries.  3. 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  a. Possible  diastolic dysfunction by prior echo.   4. History of false positive MPI in 2011 by stress Bruce protocol MPI. With this demonstrated   moderate-sized  apical septal/septal perfusion defect; however, the patient has underlying left   bundle branch block and this was performed as an exercise study.  5. Mild carotid atherosclerosis by duplex study in September of 2011. No significant stenosis.       RECOMMENDATIONS:  1. Continue exercise.  2. Continue medicines.  3. Follow up in one year, sooner as needed.  4. Follow up and a copy of the above note to Dr. Torrie Mayers and Dr. Cherre Robins.    Mert A. Octavio Graves, MD, [facility]      RAS/tutjm     cc: Torrie Mayers MD  Kara Pacer MD    rw  ____________________________  Christianne Dolin  Return Visit 30 MIN 1 year

## 2020-08-09 NOTE — Progress Notes (Signed)
[facility] OFFICE  [address] [place], [place] [address]     HEART FAILURE CLINIC VISIT     SWAYZE, PRIES    Date of Visit:  06/28/2017  Date of Birth: 12/23/1940  Age: 80 yrs.   Medical Record Number: [id number]  __  CURRENT DIAGNOSES     1. Hypercholesterolemia unspecified,  E78.00  2. Hypertension (essential or benign or malignant), I10  3. 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 LBBB, I44.7  5. Hypothyroidism, unspecified, E03.9  __  ALLERGIES     Crestor, Nausea  __  MEDICATIONS     1. AndroGel 1 % (50 mg/5 gram) transdermal gel packet,  2 qd  2. Benicar 20 Mg Tablet, 1 po T,R,S,S  3. Benicar Hct 20 Mg-12.5 Mg Tablet, 1 po MWF  4. Coreg 6.25 mg tablet, 1 po bid  5. Levoxyl 125 mcg tablet, 1 po qd  6. Lipitor 20 mg tablet, 1 po qhs  7. Zetia 10 mg tablet, 1 po q  am    __  HISTORY OF PRESENT ILLNESS    Wesley [name] returns for follow-up on his recently diagnosed cardiomyopathy. His EF was reduced  to 35-40% on echo in late January. 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 Octavio Graves switched to Coreg 3.125mg  b.i.d. once found  with the reduced EF. Pt has tolerated the medication well with no reported adverse effects. His blood pressure has been generally good-- ~130/80 at home. He continues on the Benicar.    Javoni remains very active, still going to the gym regularly  and exercising w/o issue.     __  PAST HISTORY     Past Medical Illnesses : HTN, HLD, Hypothyroidism;  Past Cardiac Illnesses: Conduction Disorder-LBBB, Cardiomyopathy(Non-Ischemic);  Infectious Diseases: Usual childhood illnesses of mumps, measles and chickenpox; Surgical Procedures : tonsillectomy 1948; Trauma History: No previous history of significant trauma.; Cardiology Procedures-Invasive : Cardiac Cath May 2012; Cardiology Procedures-Noninvasive: MPI ST 2001, 2011, Echo Sept 2011, Echocardiogram  November 2016, Heart Scan, Echocardiogram  January 2019; Left Ventricular Ejection Fraction: LVEF of 35-40% documented via echocardiogram on 06/04/2017   ___  FAMILY HISTORY  Father -- Diabetes mellitus    __  CARDIAC RISK FACTORS      Tobacco Abuse: used to smoke, but quit, 40+ yrs; Family History of Heart Disease: no family history  of cardiovascular disease; Hyperlipidemia: positive; Hypertension : positive;  Diabetes Mellitus: negative; Prior History of Heart Disease : negative; Obesity: negative; Sedentary Life Style :negative; UEA:VWUJWJXB; Menopausal:not applicable   __  SOCIAL HISTORY    Alcohol Use : Does not use alcohol; Smoking: used to smoke, but quit; Former smoker [phone]); Diet : Regular diet and Caffeine use-1-2 per day; Exercise: Exercises regularly and 300 days a year 30-45 mins cardio and weights and stretching;   __   HEART FAILURE MANAGEMENT QUALITY CHECKLIST    Weight Today: 160.00000 lbs.     NYHA Class: I  HF Hospitalization Date: n/a     HF Medications:  Beta Blocker: Yes  ACEI / ARB: Yes  Aldosterone Antagonist: No  Hydralazine: No  Nitroglycerine: No     Device Therapy:  ICD Candidate?: Pending LV Function Reassessment    LVEF : 35-40%  LV Performance Assessed: 06/04/2017  Type of Study : echocardiogram    QRS 120 msec?: Yes  Heart Failure Education :  Low Sodium Diet, Heart Failure Medications, Blood Pressure Log and Daily Weight Log  __  PHYSICAL EXAMINATION    Vital Signs:   Blood Pressure:  148/80 Sitting, Right arm, regular cuff    Weight: 160.00 lbs.   Height: 68.00"  BMI: 24   Pulse:  72/min. Apical       Constitutional: Cooperative, alert and oriented,well developed, well nourished, in no acute distress.  Skin: Warm and dry to touch, no apparent skin lesions, or masses noted. Head: normocephalic  Eyes: EOMS intact, conjunctivae and lids normal ENT: No pallor or cyanosis  Neck: no JVD, no bruits Chest: clear to auscultation bilaterally, normal respiratory excursion  Cardiac: Regular  rhythm, Apical impulse not displaced, grade 1/6 systolic murmur, paradoxical splitting of S2 Abdomen : abdomen soft Peripheral Pulses: bilateral radial pulse(s) 2+, bilateral posterior tibial pulse(s) 2+, no radiofemoral delay  Extremities/Back: No deformities,clubbing, erythema or edema observed Neurological: No gross motor  or sensory deficits noted, affect appropriate, oriented to time, person and place.   __    Medications added today by the physician:  Coreg  6.25 mg tablet, 1 po bid, 60      IMPRESSIONS:  1. 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  a. Echocardiogram in September of 2011, borderline LV systolic function,   approximately 55%. Echo Nov 2016, EF 55-60%. Echo  06/04/2017, EF down to 35-40%.  b. ACC Stage B; NYHA Class I-- pt appears total ASx.  2. Nonobstructive coronary atherosclerosis.  a. History of a CAC score elevated.  b. Reassuring cardiac catheterization on Sep 12, 2010, EF of 40-45% with    near normal coronary arteries.  3. 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  a. Possible diastolic dysfunction by prior echo.   4. History of false positive MPI in 2011 by stress Bruce protocol MPI. With this  demonstrated   moderate-sized apical septal/septal perfusion defect; however, the patient has underlying left   bundle branch block and this was performed as an exercise study.  5. Mild carotid atherosclerosis by duplex study in September  of 2011. No significant stenosis.  6. Situational work stresses.  7. Chronic lower back pain for which he receives periodic injections, which then cause recurrent,   intractable hiccups. ?Consider thorazine for hiccups.     RECOMMENDATIONS:   1. Uptitrate Coreg to 6.25 mg p.o. b.i.d.   2. Monitor blood pressure and pulse.   3. Follow up with me in two weeks to review and further uptitrate CM regimen as BP allows.   Given pt is NYHA Class I, Entresto is not indicated at this time.   4. Echo after 90d of maximal medical  therapy to reassess LVEF.  5. Encouraged to continue exercise as tolerated.      cc:   Torrie Mayers III MD  Derrell Lolling MD       Carron Curie, PA-C

## 2020-08-09 NOTE — Progress Notes (Signed)
[facility] OFFICE  [address] [place], [place] [address]           Star Age    Date of Visit:  04/21/2019  Date of Birth: 06-22-1940  Age: 80 yrs.   Medical Record Number: [id number]  __  CURRENT DIAGNOSES     1. Hypothyroidism, unspecified, E03.9   2. Hypercholesterolemia unspecified, E78.00  3. Hypertension (essential or benign or malignant), I10  4. 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 LBBB, I44.7  __  ALLERGIES     Crestor, Nausea  __  MEDICATIONS     1. AndroGel 1 % (50 mg/5 gram) transdermal gel packet,  1 qd  2. Benicar 20 mg tablet, 1 po T,R,S,S  3. Benicar HCT 20 mg-12.5 mg tablet, 1 po MWF  4. CoQ-10 30 mg capsule, 1 po qd  5. Coreg 6.25 mg tablet, 1 po bid  6. Levoxyl 125 mcg tablet, 1 po qd  7. Lipitor 20 mg tablet, 1 po  qhs  8. Vitamin D3 50 mcg (2,000 unit) tablet, 1 po qd  9. Zetia 10 mg tablet, 1 po q am  __  CHIEF COMPLAINT/REASON FOR VISIT  Followup  of Cardiomyopathy Other restrictive, Followup of Hypertension (essential or benign or malignant) and Palpitations  __  HISTORY OF PRESENT ILLNESS     Patient is a pleasant 80 year old male here with complaints of variable HR and feelings of jumpiness. He states he wakes up in the morning with his heart rate in the 60s and then when he walks around his heart rate is in the 80s and then when he exercises  or exerts himself its in the 100-130s. He uses a pulse oximeter to measure his heart rate. He denies of any associated cardiac symptoms such as chest pain/discomfort, shortness of breath, or palpitations. The feelings of jumpiness not associated with  any other cardiac complaints.     Rather, he feels like it is more of internalized anxiety more than anything else. 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 Otherwise, he continues  to exercise 5 days a week without any changes in his functional capacity or exercise tolerance. ECG in the office today shows  sinus rhythm, PAC with a left bundle branch block and a heart rate in the 80s.   __   PAST HISTORY     Past Medical Illnesses: HTN, HLD, Hypothyroidism;   Past Cardiac Illnesses: Conduction Disorder-LBBB, Cardiomyopathy(Non-Ischemic); Infectious Diseases : Usual childhood illnesses of mumps, measles and chickenpox; Surgical Procedures: tonsillectomy 1948;  Trauma History: No previous history of significant trauma.; Cardiology Procedures-Invasive: Cardiac Cath  May 2012; Cardiology Procedures-Noninvasive: MPI ST 2001, 2011, Echo Sept 2011, Echocardiogram November 2016, Heart Scan, Echocardiogram January 2019,  Echocardiogram May 2020; Left Ventricular Ejection Fraction: LVEF of 42% documented via echocardiogram on 09/19/2018   ___  FAMILY HISTORY  Father -- Diabetes mellitus    __  CARDIAC RISK FACTORS      Tobacco Abuse: used to smoke, but quit, 40+ yrs; Family History of Heart Disease: no family history  of cardiovascular disease; Hyperlipidemia: positive; Hypertension : positive;  Diabetes Mellitus: negative; Prior History of Heart Disease : negative; Obesity: negative; Sedentary Life Style :negative; MWU:[id number]; Menopausal:not applicable   __  SOCIAL HISTORY    Alcohol Use : Does not use alcohol; Smoking: used to smoke, but quit; Former smoker [phone]); Diet : Regular diet and Caffeine use-1-2 per day; Exercise: Exercises  regularly and 300 days a year 30-45 mins cardio and weights and stretching;   __   PHYSICAL EXAMINATION    Vital Signs:  Blood Pressure:  128/70 Sitting, Left arm, regular  cuff  134/68 Sitting, Right arm, regular cuff    Weight: 150.60 lbs.  Height:  68.00"  BMI: 22.90   Pulse: 84/min. Apical  Regular       Constitutional: cooperative, alert, oriented, in no acute distress Skin:  warm and dry to touch Head: normocephalic Eyes : conjunctivae and lids normal ENT: wearing mask Neck : no JVD Chest: clear to auscultation bilaterally, no use of accessory muscles, normal respiratory effort   Cardiac: Regular rhythm, S1 normal, S2 normal, No S3 or S4, no murmurs Abdomen: abdomen normal, abdomen  soft, bowel sounds normoactive Peripheral Pulses: b/l radial +2 Extremities/Back : no edema present, no erythema, no deformities noted Neurological: No gross motor or sensory deficits noted, affect appropriate, oriented to time, person  and place.   __    Medications added today by the physician:      IMPRESSIONS:  1. 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 ACC Stage B; NYHA Class I. Euvolemic and stable   from volume standpoint.   a. Echocardiogram  in September of 2011, borderline LV systolic function,  approximately 55%. Echo Nov 2016, EF 55-60%. Echo 06/04/2017,  EF down to 35-40%; to 50% June 2019.  b. Echo May 2020 LVEF 40-45%  2. Nonobstructive coronary atherosclerosis.   a. History of a CAC score elevated.  b. Reassuring cardiac catheterization on Sep 12, 2010, EF of 40-45% with  near normal coronary arteries.  3. 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   a. Possible diastolic  dysfunction by prior echo.   4. History of false positive MPI in 2011 by stress Bruce protocol MPI. With this demonstrated  moderate-sized apical septal/septal perfusion defect; however, the patient has underlying left  bundle branch block  and this was performed as an exercise study.  5. Mild carotid atherosclerosis by duplex study in September of 2011. No significant stenosis.  6. Other medical issues:  a. Recent situational work stresses, improving.  b. Chronic lower back  pain for which he receives periodic injections    RECOMMENDATIONS:  1. Continue current cardiac regimen without change.   2. Continue to maintain BP/HR log. On average SBP controlled in 115-120s, HR 70-80s.   3. Continue regular cardiovascular  exercise and follow a heart healthy diet.   4. Educated on reducing caffeine intake, as that may affect his jumpiness.   5. Echocardiogram in July to reassess LV function and  CM. as scheduled prior to annual follow up   6. Recent annual blood  work with PCP. Will request labs   7. Barring any changes, follow up with Dr. Octavio Graves in July, or sooner for any concerns.       Conception Oms, NP      cc: BAO NCyndie Chime DO    This note was generated by the [facility] EMR system/Dragon  speech recognition and may contain errors or omissions not intended by the user. Grammatical errors, random word insertions, deletions, pronoun errors, and incomplete sentences are occasional consequences of this technology due to software limitations.  Not all errors are caught or corrected. If there are questions or concerns about the content of this note or information contained within the body of this dictation, they should be addressed directly with the author for clarification.  ____________________________  [facility] from PCP  12 Lead ECG Today

## 2020-08-09 NOTE — Progress Notes (Signed)
[facility] OFFICE  [address] [place], [place] [address]     Star Age    Date of Visit:  06/11/2017  Date of Birth: Dec 19, 1940  Age: 80 yrs.   Medical Record Number: [id number]  __  CURRENT DIAGNOSES     1. Hypercholesterolemia unspecified,  E78.00  2. Hypertension (essential or benign or malignant), I10  3. 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 LBBB, I44.7  5. Hypothyroidism, unspecified, E03.9  __  ALLERGIES     Crestor, Nausea  __  MEDICATIONS     1. AndroGel 1 % (50 mg/5 gram) transdermal gel packet,  2 qd  2. Benicar 20 Mg Tablet, 1 po T,R,S,S  3. Benicar Hct 20 Mg-12.5 Mg Tablet, 1 po MWF  4. carvedilol 3.125 mg tablet, 1 po bid  5. Levoxyl 125 mcg tablet, 1 po qd  6. Lipitor 20 mg tablet, 1 po qhs  7. Zetia 10 mg tablet,  1 po q am  __  CHIEF COMPLAINT/REASON FOR VISIT  dec ef on echo, Followup of Cardiomyopathy Other restrictive, Followup of Hypertension (essential  or benign or malignant) and Followup of LBBB  __  HISTORY OF PRESENT ILLNESS  Wesley [name] is doing quite well. He exercises regularly, about  300 times a year. He has no chest pain, tightness, or heaviness. No significant dyspnea or heart failure symptoms, palpitations, dizziness, or syncope.     On his recent echo, EF was reduced to 35% to 40%. He has been taking some Inderal a few  times a week to several times a week because of some situational stresses. His blood pressure has been generally good. He continues on the Benicar.   __  PAST HISTORY      Past Medical Illnesses: HTN, HLD, Hypothyroidism;  Past Cardiac Illnesses : Conduction Disorder-LBBB; Infectious Diseases: Usual childhood illnesses of mumps, measles and chickenpox;  Surgical Procedures: tonsillectomy 1948; Trauma History: No previous history of significant trauma.;  Cardiology Procedures-Invasive: Cardiac Cath May 2012; Cardiology Procedures-Noninvasive: MPI ST 2001,  2011, Echo Sept 2011, Echocardiogram November 2016, Heart Scan, Echocardiogram  January 2019; Left Ventricular Ejection Fraction: LVEF of 35-40% documented  via echocardiogram on 06/04/2017  __  CARDIAC RISK FACTORS     Tobacco Abuse : used to smoke, but quit, 40+ yrs; Family History of Heart Disease: no family history of cardiovascular disease;  Hyperlipidemia: positive; Hypertension: positive;   Diabetes Mellitus: negative; Prior History of Heart Disease: negative;  Obesity: negative; Sedentary Life Style:negative; Age :positive  __  SOCIAL HISTORY    Alcohol Use : Does not use alcohol; Smoking: used to smoke, but quit; Former smoker [phone]); Diet : Regular diet and Caffeine use-1-2 per day; Exercise: Exercises regularly and 300 days a year 30-45 mins cardio and weights and stretching;   __   PHYSICAL EXAMINATION    Vital Signs:  Blood Pressure:   150/90 Sitting, Right arm, regular cuff    Weight: 159.00 lbs.  Height:  68.00"  BMI: 24   Pulse: 88/min. Apical Regular        Constitutional: Cooperative, alert and oriented,well developed, well nourished, in no acute distress. Skin:  Warm and dry to touch, no apparent skin lesions, or masses noted. Head: Normocephalic, normal hair pattern,  no masses or tenderness Eyes: EOMS intact ENT:  No pallor or cyanosis Neck: no JVD, no bruits Chest : clear to auscultation bilaterally Cardiac: Regular rhythm, Apical impulse not displaced, grade 1/6 systolic murmur, paradoxical splitting of S2  Abdomen:  abdomen soft Extremities/Back: no edema present  Neurological: No gross motor or sensory deficits noted, affect appropriate, oriented to time, person and place.   __     Medications added today by the physician:  carvedilol 3.125 mg tablet, 1 po bid, 60      IMPRESSIONS:   1. 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  a. Echocardiogram in September of 2011, borderline LV systolic function,   approximately 55%. Echo EF 55-60%Nov 2016-now down to 35-40% 06/04/17..  2. Nonobstructive coronary atherosclerosis.   a. History of a CAC score  elevated.  b. Reassuring cardiac catheterization on Sep 12, 2010, EF of 40-45% with   near normal coronary arteries.  3. 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  a. Possible diastolic  dysfunction by prior echo.   4. History of false positive MPI in 2011 by stress Bruce protocol MPI. With this demonstrated   moderate-sized apical septal/septal perfusion defect; however, the patient has underlying left   bundle branch block  and this was performed as an exercise study.  5. Mild carotid atherosclerosis by duplex study in September of 2011. No significant stenosis.  6. Situational work stresses.    RECOMMENDATIONS:   1. Discontinue propranolol and begin Coreg 3.125 mg p.o. b.i.d.   2. Monitor blood pressure and pulse.   3. Follow up with NP in three weeks with up-titration of medications, including likely change  of the Benicar to [facility] as well as increasing  the Coreg.   4. Echo after he has been maximally treated with up-titration of medicines.   5. Follow up with me in four months.   6. Continue exercise.   7. Further records pending response to medicines.   8. Follow up and copy of  the above note to Dr. Torrie Mayers, Dr. Rise Paganini, and  Dr. Derrell Lolling.     Darly A. Octavio Graves, MD, [facility]    RAS/tumam    cc: Torrie Mayers III MD  BAO NGUYEN MD  Derrell Lolling MD    MG  ____________________________   Christianne Dolin  Return Visit with NP/PA 3 weeks  Return Visit 15 MIN 4 months

## 2020-08-09 NOTE — H&P (Signed)
[facility] OFFICE  [address] [place], [place] [address]     Star Age    Date of Visit:  03/23/2015  Date of Birth: 11/05/1940  Age: 80 yrs.   Medical Record Number: [id number]  Referring Physician: Everardo All MD, OSCAR III  __   CURRENT DIAGNOSES     1. Hypothyroidism, unspecified, E03.9  2. Hypercholesterolemia unspecified, E78.00  3. Hypertension (essential or benign or malignant), I10  4. LBBB, I44.7  5.  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 Lipitor 20 mg tablet, 1 po qhs  2. Zetia 10 mg tablet, 1 po q am  3. Levoxyl 125 mcg tablet, 1 po qd  4. Benicar  HCT 20 mg-12.5 mg tablet, 1 po MWF  5. Benicar 20 mg tablet, 1 po T,R,S,S  6. AndroGel 1 % (50 mg/5 gram) transdermal gel packet, 2 qd  __  CHIEF COMPLAINT/REASON FOR VISIT   establish care, cv risks, LBBB and ?CM  __  HISTORY OF PRESENT ILLNESS  Mr. Wesley [name] is a 80 year old gentleman with a chronic left bundle branch  block who is referred by Dr. Torrie Mayers for cardiac consultation as he wishes cardiology followup closer to his home.     He had a CAC score of 602,000 with an unremarkable stress test. He was seen by Dr. Johnette Abraham where a reassuring MPI was performed.  He was placed on aspirin and Pravachol and subsequently taken off of aspirin. In the fall of 2011 he had a stress test and an echo that suggested a possible "old MI". He was seen by Dr. Raleigh Callas at that time. Ultimately he was seen again by Dr. Johnette Abraham and  underwent cardiac cath in 2012 with reassuring findings. His EF was modestly reduced at 40-45%. He has remained completely asymptomatic. He works out in a gym about 300 days a year with a combination of cardio and resistance training. He has no effort  angina, tightness, heaviness, pressure, or squeezing sensation. No significant dyspnea, exertional dyspnea, fatigue, reduction in exercise capacity, PND, orthopnea, or peripheral edema. No palpitations, dizziness, near  syncope, or syncope. He does have  an element of white coat syndrome.  _  PAST HISTORY     Past Medical Illnesses : HTN, HLD, Hypothyroidism;  Past Cardiac Illnesses: Conduction Disorder-LBBB;  Infectious Diseases: Usual childhood illnesses of mumps, measles and chickenpox; Surgical Procedures: tonsillectomy  1948; Trauma History: No previous history of significant trauma.; Cardiology Procedures-Invasive : Cardiac Cath May 2012; Cardiology Procedures-Noninvasive: MPI ST 2001, 2011, Echo Sept 2011   ___  FAMILY HISTORY  Father -- Diabetes mellitus    __  CARDIAC RISK FACTORS      Tobacco Abuse: used to smoke, but quit, 40+ yrs; Family History of Heart Disease: no family history  of cardiovascular disease; Hyperlipidemia: positive; Hypertension : positive;  Diabetes Mellitus: negative; Prior History of Heart Disease : negative; Obesity: negative; Sedentary Life Style :negative; EXB:[id number]  __  SOCIAL HISTORY     Alcohol Use: Does not use alcohol; Smoking: used to smoke, but quit; Former smoker [phone]);  Diet: Regular diet and Caffeine use-1-2 per day; Exercise: Exercises regularly and 300 days a year  30 mins cardio and weights and stretching;   __  REVIEW OF SYSTEMS    General : Denies recent weight loss, weight gain, fever or chills or change in exercise tolerance.; Integumentary: Denies any change in hair or nails,  rashes,  or skin lesions.; Eyes: wears eye glasses/contact lenses; Ears, Nose, Throat, Mouth : Denies any hearing loss, epistaxis, hoarseness or difficulty speaking.;Respiratory: Denies dyspnea, cough, wheezing or hemoptysis.;  Cardiovascular: Please review HPI; Abdominal : Denies ulcer disease, hematochezia or melena.; Musculoskeletal:generalized arthritis; Neurological : Denies any recurrent strokes, TIA, or seizure  disorder.; Psychiatric: Denies any depression, substance abuse or change in cognitive functions.;  Endocrine: hypothyroidism; Hematologic/Immunologic: seasonal allergies  __   PHYSICAL  EXAMINATION    Vital Signs:  Blood Pressure:    158/84 Supine, Right arm, regular  cuff  160/90 Sitting, Right arm, regular cuff  125/75 home cuff  158/82 Sitting, Right arm, regular cuff    Weight: 160.40 lbs.   Height: 68"  BMI: 24   Pulse:  96/min.       Constitutional: Cooperative, alert and oriented,well developed, well nourished, in no acute distress.  Skin: Warm and dry to touch, no apparent skin lesions, or masses noted. Head: Normocephalic, normal  hair pattern, no masses or tenderness Eyes: EOMS intact ENT : Ears, Nose and throat reveal no gross abnormalities. No pallor or cyanosis. Dentition good. Neck: No palpable masses or adenopathy, no thyromegaly,  no JVD, carotid pulses are full and equal bilaterally without bruits. Chest: Normal symmetry, no tenderness to palpation, normal respiratory excursion,  no intercostal retraction, no use of accessory muscles, normal diaphragmatic excursion, clear to auscultation and percussion. Cardiac: Regular rhythm,  Apical impulse not displaced, grade 1/6 systolic murmur, paradoxical splitting of S2 Abdomen: Abdomen soft, bowel sounds normoactive, no masses, no hepatosplenomegaly,  non-tender, no bruits Peripheral Pulses: The femoral, popliteal, dorsalis pedis, and posterior tibial pulses are full and equal bilaterally with no bruits  auscultated. Extremities/Back: No deformities, clubbing, cyanosis, erythema or edema observed. There are no spinal abnormalities noted. Normal muscle  strength and tone. Neurological: No gross motor or sensory deficits noted, affect appropriate, oriented to time, person and place.   __     Medications added today by the physician:    IMPRESSIONS:  1. 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  a. Echocardiogram in September of 2011, borderline LV systolic  function,   approximately 55%.   2. Nonobstructive coronary atherosclerosis.  a. History of a CAC score of 602,000.  b. Reassuring cardiac catheterization on  Sep 12, 2010, EF of 40-45% with   near normal coronary arteries.  3. 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  a. Possible diastolic dysfunction by prior echo.   4. History of false positive MPI in 2011 by stress Bruce protocol MPI. With this demonstrated   moderate-sized apical septal/septal  perfusion defect; however, the patient has underlying left   bundle branch block and this was performed as an exercise study.  5. Mild carotid atherosclerosis by duplex study in September of 2011. No significant stenosis.    DISCUSSION:   Wesley [name] is doing quite well. He does have evidence of some atherosclerosis. Fortunately nonobstructive both from the carotid and coronary perspective based on the above trials. His abnormal nuclear study may have been related to the left bundle branch  block as it was performed using Bruce protocol as opposed to a Lexiscan MPI which is likely more reliable than a stress MPI with a left bundle. He did have, at catheterization, mild LV dysfunction and low-normal EF otherwise any may have a mild underlying  myopathy which has been completely asymptomatic. He exercises rigorously and without issues.     RECOMMENDATIONS:  1. Echo for reassessment of LV function and possible mild myopathy.  2. Office visit in four months and if he is doing well  at that point then yearly.  3. Monitor blood pressure. He is to bring his blood pressure cuff in from home at the next   visit to make sure it is accurate. He is also to have followup with Dr. Everardo All regarding his   blood pressure.    4. Otherwise reassurance. Assuming his LV function is reasonable then given his excellent   level of exercise I would not make any other changes and would continue statin therapy and   consider the addition of a baby aspirin.   5. Followup  and copy of the above note to Dr. Torrie Mayers and Dr. Cherre Robins.     As always I appreciate the opportunity to assist you in the care of your patient  providing cardiac   consultation.     With Warmest Regards,     Pradeep A.  Octavio Graves, MD, [facility]    RAS/tuarg    cc: Torrie Mayers III MD  DAN Doreene Adas MD    rw  _________________________  Christianne Dolin  Patient Electronic Access Today  12 Lead  ECG Today  Return Visit 15 MIN 4 months  2D, color flow, doppler First Available

## 2020-08-09 NOTE — Progress Notes (Signed)
[facility] OFFICE  [address] [place], [place] [address]     Star Age    Date of Visit:  11/06/2017  Date of Birth: [birth year]  Age: 80 yrs.   Medical Record Number: [id number]  __  CURRENT DIAGNOSES     1. Hypothyroidism, unspecified, E03.9   2. Hypercholesterolemia unspecified, E78.00  3. Hypertension (essential or benign or malignant), I10  4. 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 LBBB, I44.7  __  ALLERGIES     Crestor, Nausea  __  MEDICATIONS     1. AndroGel 1 % (50 mg/5 gram) transdermal gel packet,  2 qd  2. Benicar 20 Mg Tablet, 1 po T,R,S,S  3. Benicar Hct 20 Mg-12.5 Mg Tablet, 1 po MWF  4. Coreg 6.25 mg tablet, 1 po bid  5. Entocort EC 3 mg capsule,delayed,extended release, temporary  6. Levoxyl 125 mcg tablet, 1 po qd   7. Lipitor 20 mg tablet, 1 po qhs  8. Zetia 10 mg tablet, 1 po q am  __  CHIEF COMPLAINT/REASON FOR VISIT  Followup of Cardiomyopathy Other  restrictive, Followup of Hypercholesterolemia unspecified, Followup of Hypertension (essential or benign or malignant) and Followup of LBBB  __  HISTORY OF PRESENT ILLNESS   Mr. 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 He is doing Adult nurse. He continues to exercise regularly and without difficulties. No chest pain, tightness, heaviness, significant dyspnea or heart failure symptoms, palpitations, dizziness, or syncope. When he first started  the 6.25 b.i.d. of the Coreg, he thought he might have a little dyspnea, but realizes this is not the case and certainly not now. His blood pressure log at home generally is excellent.     His echo from June - ejection fraction was about 50%.    __  PAST HISTORY     Past Medical Illnesses:  HTN, HLD, Hypothyroidism;  Past Cardiac Illnesses: Conduction Disorder-LBBB, Cardiomyopathy(Non-Ischemic);  Infectious Diseases: Usual childhood illnesses of mumps, measles and chickenpox; Surgical Procedures: tonsillectomy  1948; Trauma History: No previous history of significant trauma.; Cardiology  Procedures-Invasive : Cardiac Cath May 2012; Cardiology Procedures-Noninvasive: MPI ST 2001, 2011, Echo Sept 2011, Echocardiogram November 2016, Heart Scan, Echocardiogram  January 2019; Left Ventricular Ejection Fraction: LVEF of 50% documented via echocardiogram on 10/08/2017   SOCIAL HISTORY    Alcohol Use: Does not use alcohol;  Smoking: used to smoke, but quit; Former smoker [phone]); Diet: Regular diet and Caffeine use-1-2 per  day; Exercise: Exercises regularly and 300 days a year 30-45 mins cardio and weights and stretching;   __   PHYSICAL EXAMINATION    Vital Signs:  Blood Pressure:   140/86 Supine, Right arm, regular cuff  138/84 Sitting, Right arm, regular cuff    Weight: 160.00 lbs.   Height: 68.00"  BMI: 24   Pulse:  79/min. Apical       Constitutional: cooperative, alert, oriented, well developed, well nourished,  in no acute distress Skin: warm and dry to touch Head : normocephalic Eyes: EOMS intact, conjunctivae and lids normal  ENT: No pallor or cyanosis Neck:  no JVD, no bruits Chest: clear to auscultation bilaterally, normal respiratory excursion Cardiac : Regular rhythm, Apical impulse not displaced, grade 1/6 systolic murmur, paradoxical splitting of S2 Abdomen: abdomen soft  Peripheral Pulses: bilateral radial pulse(s) 2+, bilateral posterior tibial pulse(s) 2+, no radiofemoral delay  Extremities/Back: No deformities,clubbing, erythema or edema observed Psychiatric:  normal memory Neurological: No gross motor or sensory deficits noted, affect appropriate, oriented to  time, person  and place.   __    Medications added today by the physician:  Benicar 20 Mg Tablet, 1 po T,R,S,S, 90  Benicar Hct 20 Mg-12.5  Mg Tablet, 1 po MWF, 90      ECG:  Sinus rhythm, left bundle branch block, repolarization changes; probably no significant change.     Lipids were again excellent: LDL was in the 40s.    IMPRESSIONS:  1. 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  a. Echocardiogram  in September of  2011, borderline LV systolic function,   approximately 55%. Echo Nov 2016, EF 55-60%. Echo 06/04/2017,   EF down to 35-40%; to 50% June 2019.  b. 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  2. Nonobstructive coronary  atherosclerosis.  a. History of a CAC score elevated.  b. Reassuring cardiac catheterization on Sep 12, 2010, EF of 40-45% with   near normal coronary arteries.  3. 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   a. Possible diastolic dysfunction by prior echo.   4. History of false positive MPI in 2011 by stress Bruce protocol MPI. With this demonstrated   moderate-sized apical septal/septal perfusion defect; however, the patient has underlying left    bundle branch block and this was performed as an exercise study.  5. Mild carotid atherosclerosis by duplex study in September of 2011. No significant stenosis.  6. Recent situational work stresses, improving.   7. Chronic lower back pain  for which he receives periodic injections, which then cause recurrent,   intractable hiccups. ?Consider thorazine for hiccups.     RECOMMENDATIONS:   1. Continue medicines and exercise.  2. Monitor blood pressure, low-salt diet.  3. Labs per Dr. Cyndie Chime.  4. Office visit in one year, sooner as needed.  5. Follow up and copy of the above note to Dr. Rise Paganini, who is now his primary  care physician.  6. 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   7. Alani will let us know if he has any recurrent  issues, but we will plan on getting an  echocardiogram in one year, given the variability in his recent ejection fractions. He is tolerating  the up titration of medicine and we certainly have a fair amount of flexibility with additional   medicines if needed.    Telly A. Octavio Graves, MD, [facility]    RAS/tutbh    cc: BAO NGUYEN DO  ____________________________  TODAYS ORDERS   12  Lead ECG Today  Return Visit 30 MIN 1 year

## 2020-08-09 NOTE — Progress Notes (Signed)
[facility] OFFICE  [address] [place], [place] [address]     Star Age    Date of Visit:  07/12/2017  Date of Birth: 1941/04/21  Age: 80 yrs.   Medical Record Number: [id number]  __  CURRENT DIAGNOSES     1. Hypercholesterolemia unspecified,  E78.00  2. Hypertension (essential or benign or malignant), I10  3. 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 LBBB, I44.7  5. Hypothyroidism, unspecified, E03.9  __  ALLERGIES     Crestor, Nausea  __  MEDICATIONS     1. AndroGel 1 % (50 mg/5 gram) transdermal gel packet,  2 qd  2. Benicar 20 Mg Tablet, 1 po T,R,S,S  3. Benicar Hct 20 Mg-12.5 Mg Tablet, 1 po MWF  4. Coreg 6.25 mg tablet, 1 po bid  5. Levoxyl 125 mcg tablet, 1 po qd  6. Lipitor 20 mg tablet, 1 po qhs  7. Zetia 10 mg tablet, 1 po q  am    __  HISTORY OF PRESENT ILLNESS    Wesley [name] returns for follow-up on his recently diagnosed cardiomyopathy. His EF was reduced  to 35-40% on echo in late January. 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 Octavio Graves switched to Coreg 3.125mg  b.i.d.  once found with the reduced EF. I then increased the dose to 6.25mg  b.i.d. two weeks ago. Pt has tolerated the medication well with no reported adverse effects. His blood pressure and heart rate have been well-controlled: 110/70, HR 70s at home. He continues  on the Benicar.    [name] remains very active, still going to the gym regularly and exercising w/o issue. His stress has actually improved as he will be taking his above business partner to court, and "we know we're going to get him."     __  PAST HISTORY     Past Medical Illnesses: HTN, HLD, Hypothyroidism;  Past Cardiac Illnesses : Conduction Disorder-LBBB, Cardiomyopathy(Non-Ischemic); Infectious Diseases: Usual childhood illnesses of mumps, measles and chickenpox;  Surgical Procedures: tonsillectomy 1948; Trauma History: No  previous history of significant trauma.;  Cardiology Procedures-Invasive: Cardiac Cath May 2012; Cardiology Procedures-Noninvasive: MPI ST 2001,  2011, Echo Sept 2011, Echocardiogram November 2016, Heart Scan, Echocardiogram January 2019; Left Ventricular Ejection Fraction: LVEF of 35-40% documented  via echocardiogram on 06/04/2017  ___  FAMILY HISTORY  Father --  Diabetes mellitus    __  CARDIAC RISK FACTORS     Tobacco Abuse: used to smoke, but quit, 40+ yrs;  Family History of Heart Disease: no family history of cardiovascular disease; Hyperlipidemia: positive;  Hypertension: positive;  Diabetes Mellitus: negative;  Prior History of Heart Disease: negative; Obesity: negative;  Sedentary Life Style:negative; GUY:[id number]; Menopausal :not applicable  __  SOCIAL HISTORY     Alcohol Use: Does not use alcohol; Smoking: used to smoke, but quit; Former smoker [phone]);  Diet: Regular diet and Caffeine use-1-2 per day; Exercise: Exercises regularly and 300 days a year  30-45 mins cardio and weights and stretching;   __  PHYSICAL EXAMINATION    Vital Signs:   Blood Pressure:  134/72 Sitting, Right arm, regular cuff    Weight: 157.00 lbs.   Height: 68.00"  BMI: 24   Pulse:  72/min. Apical       Constitutional: cooperative, alert, oriented, well developed, well nourished, in no acute distress  Skin: warm and dry to touch Head: normocephalic  Eyes: EOMS intact, conjunctivae and lids normal ENT: No pallor or cyanosis  Neck: no JVD, no bruits Chest: clear to auscultation bilaterally, normal respiratory excursion  Cardiac: Regular rhythm, Apical impulse not displaced, grade 1/6 systolic murmur, paradoxical splitting of S2 Abdomen : abdomen soft Peripheral Pulses: bilateral radial pulse(s) 2+, bilateral posterior tibial pulse(s) 2+, no radiofemoral delay  Extremities/Back: No deformities,clubbing, erythema or edema observed Psychiatric: normal memory  Neurological: No gross motor or sensory deficits noted, affect appropriate,  oriented to time, person and place.   __    Medications added today  by the physician:        IMPRESSIONS:  1. 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  a. Echocardiogram in September of 2011, borderline LV systolic function,   approximately  55%. Echo Nov 2016, EF 55-60%. Echo 06/04/2017,   EF down to 35-40%.  b. 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  2. Nonobstructive coronary atherosclerosis.  a. History of a CAC score elevated.  b. Reassuring cardiac catheterization  on Sep 12, 2010, EF of 40-45% with   near normal coronary arteries.  3. 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  a. Possible diastolic dysfunction by prior echo.   4. History of false positive MPI in 2011  by stress Bruce protocol MPI. With this demonstrated   moderate-sized apical septal/septal perfusion defect; however, the patient has underlying left   bundle branch block and this was performed as an exercise study.  5. Mild carotid atherosclerosis  by duplex study in September of 2011. No significant stenosis.  6. Recent situational work stresses, improving.   7. Chronic lower back pain for which he receives periodic injections, which then cause recurrent,   intractable hiccups. ?Consider  thorazine for hiccups.     RECOMMENDATIONS:  1. No changes today. He appears to be at max medical therapy.   2. Continue to monitor blood pressure and pulse.   3. Again, given pt is ACC Stage B / NYHA Class I, Entresto is not indicated  at this time.  4. Echo after 90d of maximal medical therapy to reassess LVEF.  5. Encouraged to continue exercise as tolerated.  6. Follow-up after echo. If EF remains 35% or below, then discuss BiV ICD with EP.          cc:   Torrie Mayers III MD      ____________________________  TODAYS ORDERS  2D, color flow, doppler 3 months  Return Visit 15  MIN 3 months - ras        Carron Curie, PA-C

## 2020-08-24 ENCOUNTER — Other Ambulatory Visit (INDEPENDENT_AMBULATORY_CARE_PROVIDER_SITE_OTHER): Payer: Self-pay | Admitting: Family Medicine

## 2020-08-24 DIAGNOSIS — R110 Nausea: Secondary | ICD-10-CM

## 2020-09-13 ENCOUNTER — Ambulatory Visit (INDEPENDENT_AMBULATORY_CARE_PROVIDER_SITE_OTHER): Payer: Medicare Other

## 2020-09-13 ENCOUNTER — Other Ambulatory Visit (INDEPENDENT_AMBULATORY_CARE_PROVIDER_SITE_OTHER): Payer: Medicare Other

## 2020-09-13 DIAGNOSIS — I447 Left bundle-branch block, unspecified: Secondary | ICD-10-CM

## 2020-09-14 ENCOUNTER — Encounter (INDEPENDENT_AMBULATORY_CARE_PROVIDER_SITE_OTHER): Payer: Self-pay | Admitting: Physician Assistant

## 2020-09-14 ENCOUNTER — Encounter (INDEPENDENT_AMBULATORY_CARE_PROVIDER_SITE_OTHER): Payer: Self-pay | Admitting: Family Medicine

## 2020-09-14 DIAGNOSIS — G5601 Carpal tunnel syndrome, right upper limb: Secondary | ICD-10-CM | POA: Insufficient documentation

## 2020-11-08 NOTE — Progress Notes (Signed)
[facility] OFF[facility] PROGRESS NOTE    [facility] OFFICE -CARDIOLOGY  [address]  [place] [address]  Dept: [phone]  Dept Fax: [phone]       Patient Name: Wesley [name], Wesley [name]    Date of Visit:  November 14, 2020  Date of Birth: 12-19-1940  AGE: 80 y.o.  Medical Record #: [id number]  Requesting Physician: Maebelle Munroe, DO      CHIEF COMPLAINT: Palpitations      HISTORY OF PRESENT ILLNESS:    Wesley [name] is a pleasant 80 y.o. male who presents today for cv risks, cm  Wesley [name] is doing quite well.  Wesley [name] exercises regularly without issues.  Fortunately no chest pain, tightness, heaviness pressure or squeezing.  No significant shortness of breath, PND, orthopnea or peripheral edema.  No palpitations, dizziness, near-syncope or syncope.    PAST MEDICAL HISTORY: Wesley [name] has a past medical history of Cardiomyopathy, Claustrophobia, Collagenous colitis, echocardiogram (01/2010, 03/2015, 05/2017, 09/2018), Gastroesophageal reflux disease, heart scan, Hyperlipidemia, Hypertension, Hypothyroidism, LBBB (left bundle branch block), Low back pain, Lumbar spondylosis, MPI ST (2001, 2011), Mumps, Seasonal allergic rhinitis, and Vitamin D deficiency. Wesley [name] has a past surgical history that includes EGD (10/31/2018); Colonoscopy (06/21/2017); TONSILLECTOMY (05/07/1944); Cardiac catheterization (09/2010); and LAMINECTOMY, POSTERIOR LUMBAR, DECOMPRESSION, LEVEL 2 (Right, 03/17/2020).    ALLERGIES:   Allergies   Allergen Reactions    Lipitor [Atorvastatin] Other (See Comments)     ck       MEDICATIONS:   Current Outpatient Medications:     ALPRAZolam (Xanax XR) 0.5 MG 24 hr tablet, Take 1 tablet (0.5 mg total) by mouth daily as needed (anxiety), Disp: 30 tablet, Rfl: 2    cetirizine (ZyrTEC Allergy) 10 MG tablet, Take 1 tablet (10 mg total) by mouth daily (Patient taking differently: Take 10 mg by mouth nightly as needed), Disp: 30 tablet, Rfl: 11    Cholecalciferol (VITAMIN D) 2000 UNITS  Cap, Take 5,000 Unit by mouth every morning  , Disp: , Rfl:     clotrimazole-betamethasone (LOTRISONE) cream, Apply to affected area 2 times daily, Disp: 45 g, Rfl: 1    Coenzyme Q10 (CO Q 10) 10 MG Cap, Take by mouth every morning  , Disp: , Rfl:     ezetimibe (ZETIA) 10 MG tablet, Take 1 tablet (10 mg total) by mouth daily, Disp: 90 tablet, Rfl: 1    gabapentin (NEURONTIN) 300 MG capsule, Take 300 mg by mouth 3 (three) times daily as needed, Disp: , Rfl:     Levoxyl 125 MCG tablet, Take 1 tablet (125 mcg total) by mouth Once a day at 6:00am, Disp: 90 tablet, Rfl: 1    lidocaine (Lidoderm) 5 %, Apply 1 patch for up to 12 hours a day, Remove & Discard patch within 12 hours, Disp: 30 patch, Rfl: 3    loperamide (IMODIUM) 2 MG capsule, Take 2 mg by mouth as needed for Diarrhea  , Disp: , Rfl:     ondansetron (ZOFRAN) 4 MG tablet, TAKE ONE TABLET BY MOUTH EVERY EIGHT HOURS AS NEEDED FOR NAUSEA, Disp: 30 tablet, Rfl: 0    testosterone (ANDROGEL) 50 MG/5GM (1%) Gel, Place 5 g onto the skin daily 1 pack, 3 day /week , Disp: , Rfl:     carvedilol (Coreg) 6.25 MG tablet, Take 1 tablet (6.25 mg total) by mouth 2 (two) times daily, Disp: 180 tablet, Rfl: 3    olmesartan (BENICAR) 20 MG tablet, Take 1  tablet (20 mg total) by mouth daily 4 days a week., Disp: 90 tablet, Rfl: 3    olmesartan-hydrochlorothiazide (BENICAR HCT) 20-12.5 MG per tablet, Take 1 tablet by mouth daily 3 days a week., Disp: 90 tablet, Rfl: 3     FAMILY HISTORY: family history includes Colon cancer in an other family member; Diabetes in Wesley [name] father; Migraines in Wesley [name] mother; Stomach cancer in Wesley [name] sister; Thyroid disease (age of onset: 57) in Wesley [name] sister.    SOCIAL HISTORY: Wesley [name] reports that Wesley [name] has quit smoking. Wesley [name] smoked an average of 1.00 packs per day. Wesley [name] has never used smokeless tobacco. Wesley [name] reports that Wesley [name] does not drink alcohol and does not use drugs.    PHYSICAL EXAMINATION    Visit Vitals  BP 124/68 (BP Site: Left arm, Patient Position: Sitting, Cuff  Size: Medium)   Pulse 68   Ht 1.727 m (5\' 8" )   Wt 69.9 kg (154 lb)   BMI 23.42 kg/m       General Appearance:  A well-appearing male in no acute distress.    Skin: Warm and dry to touch, no apparent skin lesions, or masses noted.  Head: Normocephalic, normal hair pattern, no masses or tenderness   Eyes: EOMS Intact, PERRL, conjunctivae and lids unremarkable.  ENT: Ears, Nose and throat reveal no gross abnormalities.  No pallor or cyanosis.  Dentition good.   Neck: JVP normal, no carotid bruit, thyroid not enlarged   Chest: Clear to auscultation bilaterally with good air movement and respiratory effort and no wheezes, rales, or rhonchi   Cardiovascular: Regular rhythm, S1 normal, S2 normal, No S3 or S4. Apical impulse not displaced. No murmur. No gallops or rubs detected   Abdomen: Soft, nontender, nondistended, with normoactive bowel sounds. No organomegaly.  No pulsatile masses, or bruits.   Extremities: Warm without edema. No clubbing, or cyanosis. All peripheral pulses are full and equal.   Neuro: Alert and oriented x3. No gross motor or sensory deficits noted, affect appropriate.        LABS:   Lab Results   Component Value Date    WBC 5.9 04/18/2020    HGB 12.5 (L) 04/18/2020    HCT 36.8 (L) 04/18/2020    PLT 233 04/18/2020     Lab Results   Component Value Date    GLU 73 04/22/2020    BUN 12 04/22/2020    CREAT 0.85 04/22/2020    NA 136 04/22/2020    K 4.3 04/22/2020    CL 97 04/22/2020    CO2 26 04/22/2020    AST 36 04/22/2020    ALT 33 04/22/2020     Lab Results   Component Value Date    MG 2.2 04/18/2020    TSH 1.070 04/18/2020    HGBA1C 5.4 04/18/2020     No results found for: CHOL, TRIG, HDL, LDL  Echo May 2022 Conclusions:   1. Normal echocardiogram.  2. Compared to the prior study, slight improvement in EF,               otherwise no significant changes were found.      IMPRESSION:   Wesley [name] is a 80 y.o. male with the following problems:    1.         Cardiomyopathy with chronic left bundle branch  block.  ACC Stage B; NYHA Class I. Euvolemic and stable from volume standpoint-LVEF improved.  a.         Echocardiogram in September of 2011, borderline LV systolic function,approximately 55%.  Echo Nov 2016, EF 55-60%. Echo 06/04/2017,EF down to 35-40%; to 50% June 2019.                          b.         Echo May 2020 LVEF 40-45%; ~45%(Biplane 47%) July 2021; LVEF 55% May 2022.  2.         Nonobstructive coronary atherosclerosis.                          a.         History of a CAC score elevated.                          b.         Reassuring cardiac catheterization on Sep 12, 2010, EF of 40-45% with near normal coronary arteries.  3.         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                          a.         Possible diastolic dysfunction by prior echo.  4.         History of false positive MPI in 2011 by stress Bruce protocol MPI.  With this demonstrated moderate-sized apical septal/septal perfusion defect; however, the patient has underlying left bundle branch block and this was performed as an exercise study.  5.         Mild carotid atherosclerosis by duplex study in September of 2011.  No significant stenosis.  6.           Prior Elevated CK: Patient states about 2300-2500 probably multifactorial from excessive exercise inadequate hydration and statin use but for which statin has been held -much better  7.         Other medical issues:                          a.         Recent situational work stresses, improving.                          b.         Chronic lower back pain for which Wesley [name] receives periodic injections.  Wesley [name] gets hiccups and takes Zofran prophylactically.                          c.         Noted elevated HR without symptoms with exercise and exertion with quick recovery.  In part from using Sudafed as well as probably missing Wesley [name] morning Coreg.  Wesley [name] states Wesley [name] generally takes the Coreg.  Better w/cessation of sudafed and taking  meds           RECOMMENDATIONS:  1.         Continue diet, encourage exercise as able and as directed.                            -Continued aggressive lifestyle changes.  2.  Continue medicines as prescribed.    3.         The following tests have been recommended:     None  4.         Office follow-up with     Dr Clarita Crane cardiologist ~ 1 year  5.         Follow-up and copy of the above note to PCP and specialists.  6.         Low salt diet (2g Na), monitor bp.  Target <135/85.   We talked about the [place] [employer] and a hopeful upcoming football season.    This note was generated by the Epic system/Dragon speech recognition and may contain errors or omissions not intended by the user. Grammatical errors, random word insertions, deletions, pronoun errors, and incomplete sentences are occasional consequences of this technology due to software limitations. Not all errors are caught or corrected. If there are questions or concerns about the content of this note or information contained within the body of this dictation, they should be addressed directly with the author for clarification.                                                       Orders Placed This Encounter   Procedures    Office Visit ([facility] [place])       Orders Placed This Encounter   Medications    carvedilol (Coreg) 6.25 MG tablet     Sig: Take 1 tablet (6.25 mg total) by mouth 2 (two) times daily     Dispense:  180 tablet     Refill:  3    olmesartan (BENICAR) 20 MG tablet     Sig: Take 1 tablet (20 mg total) by mouth daily 4 days a week.     Dispense:  90 tablet     Refill:  3    olmesartan-hydrochlorothiazide (BENICAR HCT) 20-12.5 MG per tablet     Sig: Take 1 tablet by mouth daily 3 days a week.     Dispense:  90 tablet     Refill:  3       SIGNED:    Lamount Cranker, MD          This note was generated by the Dragon speech recognition and may contain errors or omissions not intended by the user. Grammatical errors, random word insertions,  deletions, pronoun errors, and incomplete sentences are occasional consequences of this technology due to software limitations. Not all errors are caught or corrected. If there are questions or concerns about the content of this note or information contained within the body of this dictation, they should be addressed directly with the author for clarification.

## 2020-11-14 ENCOUNTER — Ambulatory Visit (INDEPENDENT_AMBULATORY_CARE_PROVIDER_SITE_OTHER): Payer: Medicare Other | Admitting: Cardiology

## 2020-11-14 ENCOUNTER — Encounter (INDEPENDENT_AMBULATORY_CARE_PROVIDER_SITE_OTHER): Payer: Self-pay | Admitting: Cardiology

## 2020-11-14 VITALS — BP 124/68 | HR 68 | Ht 68.0 in | Wt 154.0 lb

## 2020-11-14 DIAGNOSIS — I429 Cardiomyopathy, unspecified: Secondary | ICD-10-CM

## 2020-11-14 DIAGNOSIS — I10 Essential (primary) hypertension: Secondary | ICD-10-CM

## 2020-11-14 DIAGNOSIS — I447 Left bundle-branch block, unspecified: Secondary | ICD-10-CM

## 2020-11-14 MED ORDER — OLMESARTAN MEDOXOMIL 20 MG PO TABS
20.0000 mg | ORAL_TABLET | Freq: Every day | ORAL | 3 refills | Status: AC
Start: 2020-11-14 — End: ?

## 2020-11-14 MED ORDER — CARVEDILOL 6.25 MG PO TABS
6.2500 mg | ORAL_TABLET | Freq: Two times a day (BID) | ORAL | 3 refills | Status: AC
Start: 2020-11-14 — End: ?

## 2020-11-14 MED ORDER — OLMESARTAN MEDOXOMIL-HCTZ 20-12.5 MG PO TABS
1.0000 | ORAL_TABLET | Freq: Every day | ORAL | 3 refills | Status: AC
Start: 2020-11-14 — End: ?

## 2020-11-17 ENCOUNTER — Encounter (INDEPENDENT_AMBULATORY_CARE_PROVIDER_SITE_OTHER): Payer: Self-pay | Admitting: Family Medicine

## 2020-11-18 ENCOUNTER — Encounter (INDEPENDENT_AMBULATORY_CARE_PROVIDER_SITE_OTHER): Payer: Self-pay | Admitting: Family Medicine

## 2020-11-19 ENCOUNTER — Encounter (INDEPENDENT_AMBULATORY_CARE_PROVIDER_SITE_OTHER): Payer: Self-pay | Admitting: Family Medicine

## 2020-11-25 ENCOUNTER — Encounter (INDEPENDENT_AMBULATORY_CARE_PROVIDER_SITE_OTHER): Payer: Self-pay | Admitting: Family Medicine

## 2021-02-20 IMAGING — MR MRI LUMBAR SPINE WITHOUT CONTRAST
6 series · 48 of 48 positions shown · non-contrast
Comparison: none

﻿MRI OF THE LUMBAR SPINE WITHOUT CONTRAST
INDICATION: Back pain.
TECHNIQUE: MRI lumbar spine was performed.  Multiplanar, multisequential imaging was performed using standard protocol on a low-field strength open MRI scanner.

[Series 7: T2 · axial · 4.0mm · 0.98mm/px · z∈[+83,+101]mm · 2 of 5 slices shown (1 of 4)]
[im 1/5]
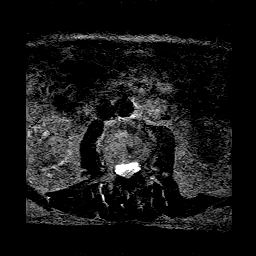
[im 5/5]
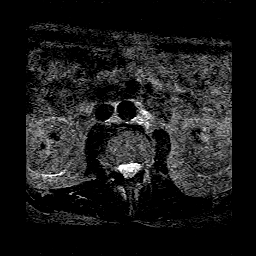

[Series 9: T2 · sagittal · 4.0mm · 1.17mm/px · 6 of 10 slices shown (2 of 4)]
[im 1/10]
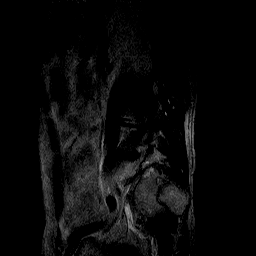
[im 2/10]
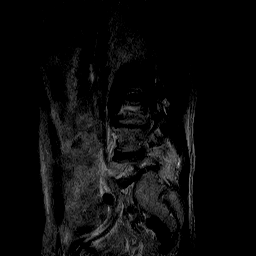
[im 4/10]
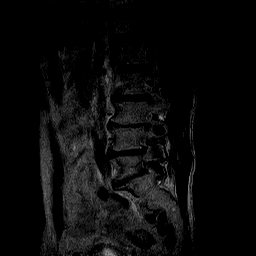
[im 6/10]
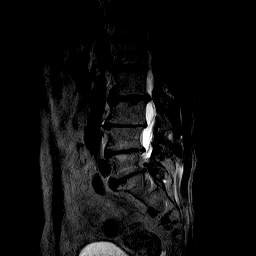
[im 8/10]
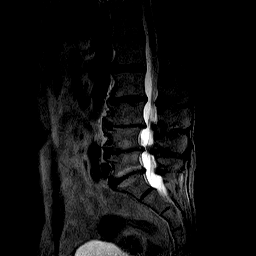
[im 10/10]
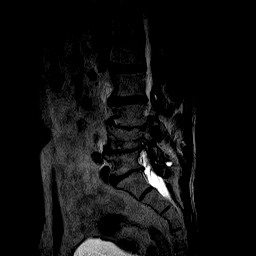

[Series 10: T1 · sagittal · 4.0mm · 1.17mm/px · 8 of 14 slices shown (1 of 2)]
[im 1/14]
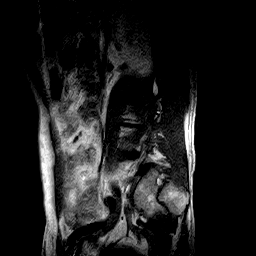
[im 2/14]
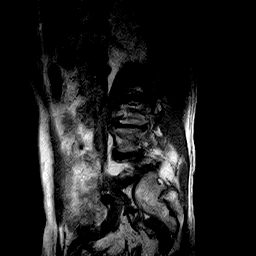
[im 4/14]
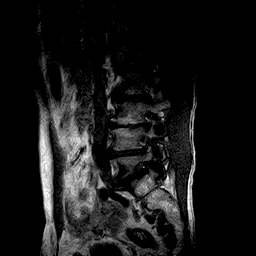
[im 6/14]
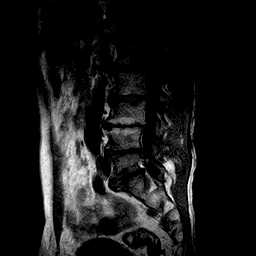
[im 8/14]
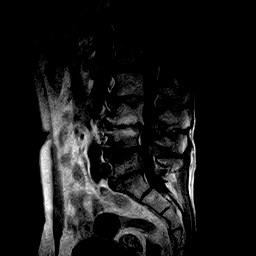
[im 10/14]
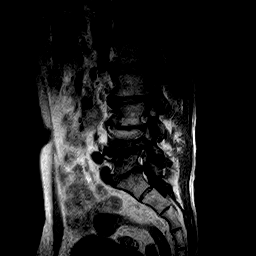
[im 12/14]
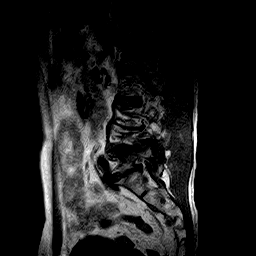
[im 14/14]
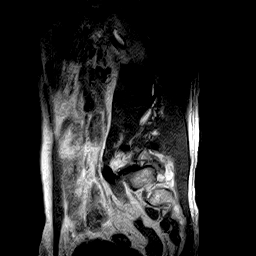

[Series 11: T2 · axial · 4.0mm · 0.98mm/px · z∈[-41,+101]mm · 12 of 20 slices shown (3 of 4)]
[im 1/20]
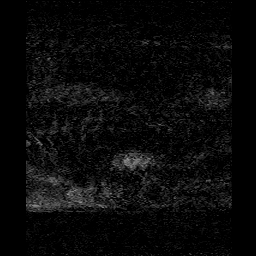
[im 2/20]
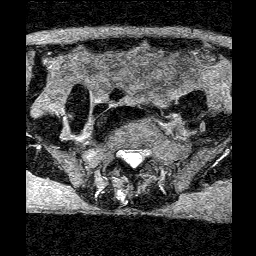
[im 4/20]
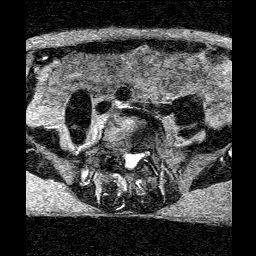
[im 6/20]
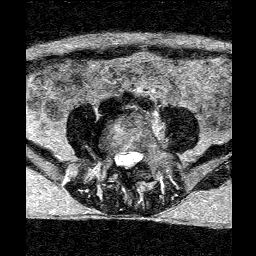
[im 7/20]
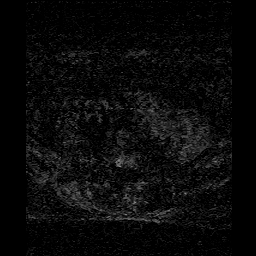
[im 9/20]
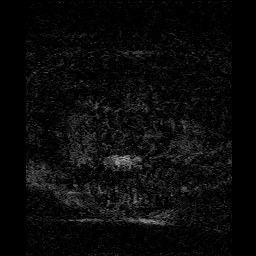
[im 11/20]
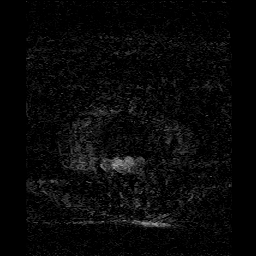
[im 13/20]
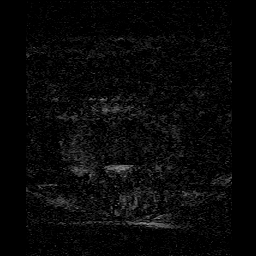
[im 14/20]
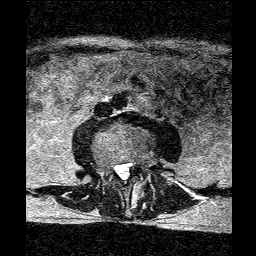
[im 16/20]
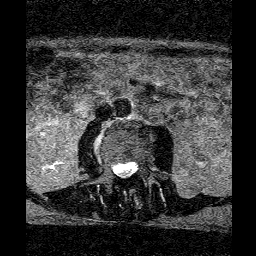
[im 18/20]
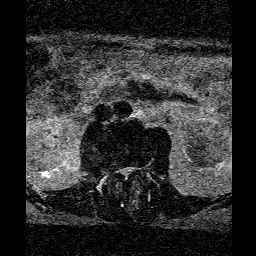
[im 20/20]
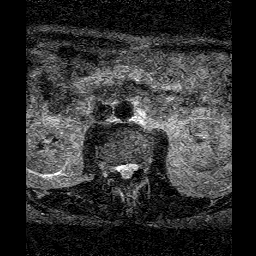

[Series 12: T2 · coronal · 4.0mm · 1.17mm/px · 8 of 14 slices shown (4 of 4)]
[im 1/14]
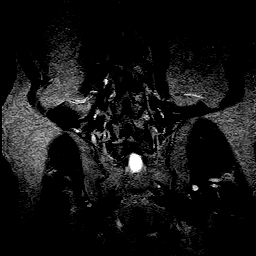
[im 2/14]
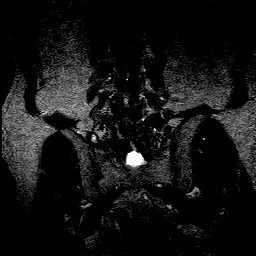
[im 4/14]
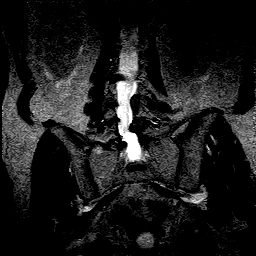
[im 6/14]
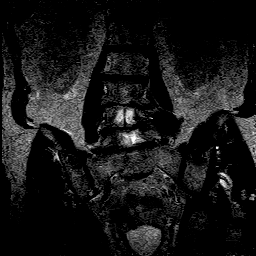
[im 8/14]
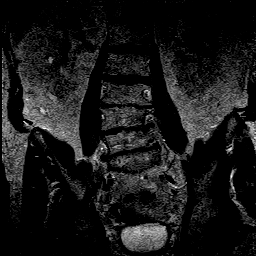
[im 10/14]
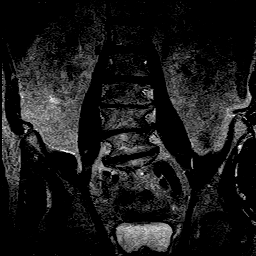
[im 12/14]
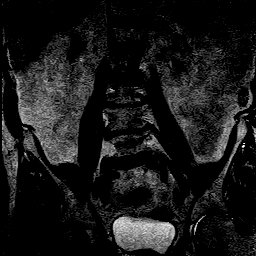
[im 14/14]
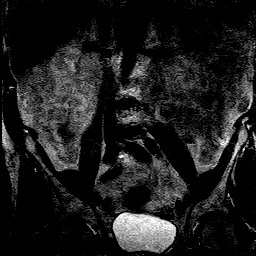

[Series 13: T1 · axial · 4.0mm · 0.98mm/px · z∈[-41,+101]mm · 12 of 20 slices shown (2 of 2)]
[im 1/20]
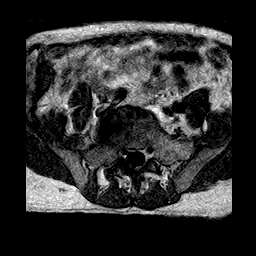
[im 2/20]
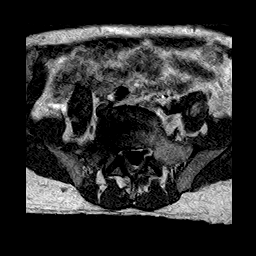
[im 4/20]
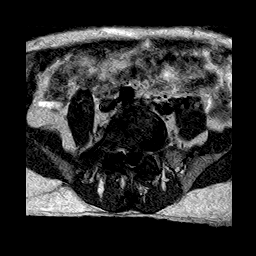
[im 6/20]
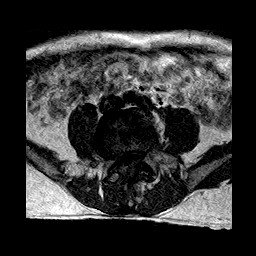
[im 7/20]
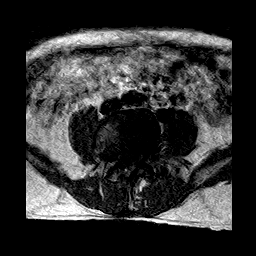
[im 9/20]
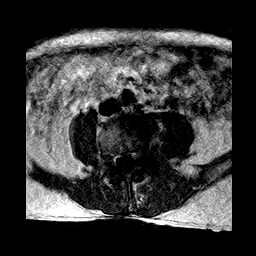
[im 11/20]
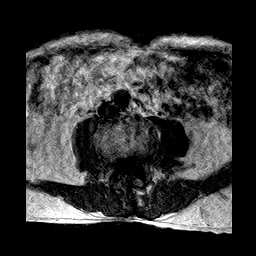
[im 13/20]
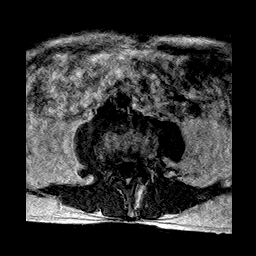
[im 14/20]
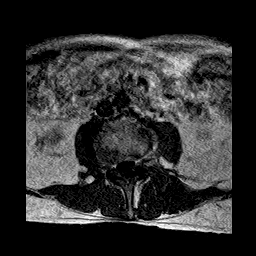
[im 16/20]
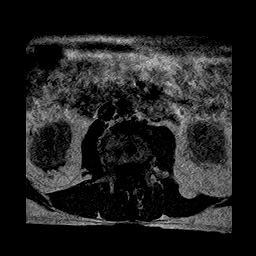
[im 18/20]
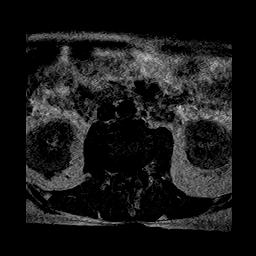
[im 20/20]
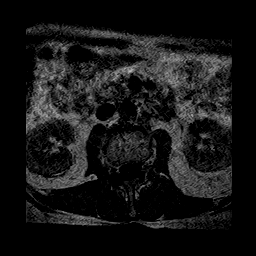

[48 of 48 positions shown; findings below may reference images not displayed]

FINDINGS: There are five lumbar-type vertebrae.  There is dextrolumbar scoliotic curvature.  Vertebral body heights, vertebral alignment, and marrow signal are within normal limits.  Specific findings are identified at the following levels:  

L1-L2:  Not included on the axial images.

L2-L3:  Moderate to severe intervertebral disc space narrowing with disc desiccation and osteophyte formation.  There is Schmorl node formation along the superior endplate of L3.  There is circumferential disc bulging with ligamentum flavum infolding contributing to mild central canal stenosis.  Circumferential bulging also contributes to mild bilateral neural foraminal narrowing.

L3-L4:  Severe intervertebral disc space narrowing with disc desiccation and osteophyte formation.  There is Schmorl node formation along the superior endplate of L4.  Circumferential disc bulging with ligamentum flavum infolding, contributing to mild central canal stenosis.  Circumferential bulging and posterolateral osteophytes, as well as facet arthrosis contribute to moderate bilateral neural foraminal narrowing.

L4-L5:  Severe intervertebral disc space narrowing with disc desiccation, vacuum disc phenomenon, and osteophyte formation.  Mixed type I/II subchondral endplate Modic changes.  Circumferential disc bulging with ligamentum flavum infolding contributes to moderate to severe central canal stenosis.  Severe facet joint arthrosis, bulging disc material, and posterolateral osteophytes contribute to severe bilateral neural foraminal narrowing.

L5-S1:  Severe intervertebral disc space narrowing with reactive subchondral type I endplate Modic changes.  There is disc desiccation with vacuum disc phenomenon and osteophyte formation.  Circumferential disc bulging contributes to moderate central canal stenosis and severe bilateral neural foraminal narrowing.

MRI OF THE LUMBAR SPINE WITHOUT CONTRAST (Continued):

The visualized SI joints, paraspinal muscles and retroperitoneum are unremarkable.
IMPRESSION: 1. Degenerative disc disease at L2-3, with mild central canal stenosis and mild bilateral neural foraminal narrowing.

2. Degenerative disease at L3-4, with mild central canal stenosis and moderate bilateral neural foraminal narrowing.

3. Severe degenerative disc disease at L4-5, with moderate to severe central canal stenosis and severe bilateral neural foraminal narrowing.

4. Degenerative disease at L5-S1, with moderate central canal stenosis and severe bilateral neural foraminal narrowing.

## 2021-04-19 ENCOUNTER — Encounter (INDEPENDENT_AMBULATORY_CARE_PROVIDER_SITE_OTHER): Payer: Self-pay | Admitting: Family Medicine

## 2021-04-21 NOTE — Progress Notes (Unsigned)
HEART CARDIOLOGY OFFICE PROGRESS NOTE    [facility] OFFICE -CARDIOLOGY  [address]  [place] [address]  Dept: [phone]  Dept Fax: [phone]       Patient Name: Wesley [name], Wesley [name]    Date of Visit:  April 21, 2021  Date of Birth: 12-15-1940  AGE: 80 y.o.  Medical Record #: [id number]  Requesting Physician: Maebelle Munroe, DO      CHIEF COMPLAINT: No chief complaint on file.      HISTORY OF PRESENT ILLNESS:    He is a pleasant 80 y.o. male who presents today for CM, CV risks  Wesley [name]    PAST MEDICAL HISTORY: He has a past medical history of Cardiomyopathy, Claustrophobia, Collagenous colitis, echocardiogram (01/2010, 03/2015, 05/2017, 09/2018), Gastroesophageal reflux disease, heart scan, Hyperlipidemia, Hypertension, Hypothyroidism, LBBB (left bundle branch block), Low back pain, Lumbar spondylosis, MPI ST (2001, 2011), Mumps, Seasonal allergic rhinitis, and Vitamin D deficiency. He has a past surgical history that includes EGD (10/31/2018); Colonoscopy (06/21/2017); TONSILLECTOMY (05/07/1944); Cardiac catheterization (09/2010); and LAMINECTOMY, POSTERIOR LUMBAR, DECOMPRESSION, LEVEL 2 (Right, 03/17/2020).    ALLERGIES:   Allergies   Allergen Reactions    Lipitor [Atorvastatin] Other (See Comments)     ck       MEDICATIONS:   Patient's current medications were reviewed. ONLY Cardiac medications were updated unless others were addressed in assessment and plan.    Current Outpatient Medications:     ALPRAZolam (Xanax XR) 0.5 MG 24 hr tablet, Take 1 tablet (0.5 mg total) by mouth daily as needed (anxiety), Disp: 30 tablet, Rfl: 2    carvedilol (Coreg) 6.25 MG tablet, Take 1 tablet (6.25 mg total) by mouth 2 (two) times daily, Disp: 180 tablet, Rfl: 3    cetirizine (ZyrTEC Allergy) 10 MG tablet, Take 1 tablet (10 mg total) by mouth daily (Patient taking differently: Take 10 mg by mouth nightly as needed), Disp: 30 tablet, Rfl: 11    Cholecalciferol  (VITAMIN D) 2000 UNITS Cap, Take 5,000 Unit by mouth every morning  , Disp: , Rfl:     clotrimazole-betamethasone (LOTRISONE) cream, Apply to affected area 2 times daily, Disp: 45 g, Rfl: 1    Coenzyme Q10 (CO Q 10) 10 MG Cap, Take by mouth every morning  , Disp: , Rfl:     ezetimibe (ZETIA) 10 MG tablet, Take 1 tablet (10 mg total) by mouth daily, Disp: 90 tablet, Rfl: 1    gabapentin (NEURONTIN) 300 MG capsule, Take 300 mg by mouth 3 (three) times daily as needed, Disp: , Rfl:     Levoxyl 125 MCG tablet, Take 1 tablet (125 mcg total) by mouth Once a day at 6:00am, Disp: 90 tablet, Rfl: 1    lidocaine (Lidoderm) 5 %, Apply 1 patch for up to 12 hours a day, Remove & Discard patch within 12 hours, Disp: 30 patch, Rfl: 3    loperamide (IMODIUM) 2 MG capsule, Take 2 mg by mouth as needed for Diarrhea  , Disp: , Rfl:     olmesartan (BENICAR) 20 MG tablet, Take 1 tablet (20 mg total) by mouth daily 4 days a week., Disp: 90 tablet, Rfl: 3    olmesartan-hydrochlorothiazide (BENICAR HCT) 20-12.5 MG per tablet, Take 1 tablet by mouth daily 3 days a week., Disp: 90 tablet, Rfl: 3    ondansetron (ZOFRAN) 4 MG tablet, TAKE ONE TABLET BY MOUTH EVERY EIGHT HOURS AS NEEDED FOR NAUSEA, Disp: 30 tablet,  Rfl: 0    testosterone (ANDROGEL) 50 MG/5GM (1%) Gel, Place 5 g onto the skin daily 1 pack, 3 day /week , Disp: , Rfl:      FAMILY HISTORY: family history includes Colon cancer in an other family member; Diabetes in his father; Migraines in his mother; Stomach cancer in his sister; Thyroid disease (age of onset: 64) in his sister.    SOCIAL HISTORY: He reports that he has quit smoking. He smoked an average of 1 pack per day. He has never used smokeless tobacco. He reports that he does not drink alcohol and does not use drugs.    PHYSICAL EXAMINATION    There were no vitals taken for this visit.    General Appearance:  A well-appearing male in no acute distress.    Skin: Warm and dry to touch, no apparent skin lesions, or masses  noted.  Head: Normocephalic, normal hair pattern, no masses or tenderness   Eyes: EOMS Intact, PERRL, conjunctivae and lids unremarkable.  ENT: Ears, Nose and throat reveal no gross abnormalities.  No pallor or cyanosis.  Dentition good.   Neck: JVP normal, no carotid bruit, thyroid not enlarged   Chest: Clear to auscultation bilaterally with good air movement and respiratory effort and no wheezes, rales, or rhonchi   Cardiovascular: Regular rhythm, S1 normal, S2 normal, No S3 or S4. Apical impulse not displaced. No murmur. No gallops or rubs detected   Abdomen: Soft, nontender, nondistended, with normoactive bowel sounds. No organomegaly.  No pulsatile masses, or bruits.   Extremities: Warm without edema. No clubbing, or cyanosis. All peripheral pulses are full and equal.   Neuro: Alert and oriented x3. No gross motor or sensory deficits noted, affect appropriate.        ECG:       LABS:   Lab Results   Component Value Date    WBC 5.9 04/18/2020    HGB 12.5 (L) 04/18/2020    HCT 36.8 (L) 04/18/2020    PLT 233 04/18/2020     Lab Results   Component Value Date    GLU 73 04/22/2020    BUN 12 04/22/2020    CREAT 0.85 04/22/2020    NA 136 04/22/2020    K 4.3 04/22/2020    CL 97 04/22/2020    CO2 26 04/22/2020    AST 36 04/22/2020    ALT 33 04/22/2020     Lab Results   Component Value Date    MG 2.2 04/18/2020    TSH 1.070 04/18/2020    HGBA1C 5.4 04/18/2020     No results found for: CHOL, TRIG, HDL, LDL        IMPRESSION:   Wesley [name] is a 80 y.o. male with the following problems:    1.         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  ACC Stage B; NYHA Class I. Euvolemic and stable from volume standpoint-LVEF improved.                          a.         Echocardiogram in September of 2011, borderline LV systolic function,approximately 55%.  Echo Nov 2016, EF 55-60%. Echo 06/04/2017,EF down to 35-40%; to 50% June 2019.                          b.         Echo  May 2020 LVEF 40-45%; ~45%(Biplane 47%) July  2021; LVEF 55% May 2022.  2.         Nonobstructive coronary atherosclerosis.                          a.         History of a CAC score elevated.                          b.         Reassuring cardiac catheterization on Sep 12, 2010, EF of 40-45% with near normal coronary arteries.  3.         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                          a.         Possible diastolic dysfunction by prior echo.  4.         History of false positive MPI in 2011 by stress Bruce protocol MPI.  With this demonstrated moderate-sized apical septal/septal perfusion defect; however, the patient has underlying left bundle branch block and this was performed as an exercise study.  5.         Mild carotid atherosclerosis by duplex study in September of 2011.  No significant stenosis.  6.           Prior Elevated CK: Patient states about 2300-2500 probably multifactorial from excessive exercise inadequate hydration and statin use but for which statin has been held -much better    7.         Other medical issues:                          a.         Recent situational work stresses, improving.                          b.         Chronic lower back pain for which he receives periodic injections.  He gets hiccups and takes Zofran prophylactically.                          c.         Noted elevated HR without symptoms with exercise and exertion with quick recovery.  In part from using Sudafed as well as probably missing his morning Coreg.  He states he generally takes the Coreg.  Better w/cessation of sudafed and taking meds        RECOMMENDATIONS:    ***                                                     No orders of the defined types were placed in this encounter.      No orders of the defined types were placed in this encounter.      SIGNED:    Lamount Cranker, MD          This note was generated by the Dragon speech recognition and may contain errors or omissions not intended by the  user.  Grammatical errors, random word insertions, deletions, pronoun errors, and incomplete sentences are occasional consequences of this technology due to software limitations. Not all errors are caught or corrected. If there are questions or concerns about the content of this note or information contained within the body of this dictation, they should be addressed directly with the author for clarification.

## 2021-04-24 ENCOUNTER — Ambulatory Visit (INDEPENDENT_AMBULATORY_CARE_PROVIDER_SITE_OTHER): Payer: Medicare Other | Admitting: Cardiology

## 2021-04-24 ENCOUNTER — Encounter (INDEPENDENT_AMBULATORY_CARE_PROVIDER_SITE_OTHER): Payer: Self-pay | Admitting: Cardiology

## 2021-04-24 VITALS — BP 142/80 | Ht 68.0 in | Wt 148.0 lb

## 2021-04-24 DIAGNOSIS — I10 Essential (primary) hypertension: Secondary | ICD-10-CM

## 2021-04-24 DIAGNOSIS — E785 Hyperlipidemia, unspecified: Secondary | ICD-10-CM

## 2021-04-24 DIAGNOSIS — I420 Dilated cardiomyopathy: Secondary | ICD-10-CM

## 2021-04-24 DIAGNOSIS — I429 Cardiomyopathy, unspecified: Secondary | ICD-10-CM

## 2021-04-24 DIAGNOSIS — I2510 Atherosclerotic heart disease of native coronary artery without angina pectoris: Secondary | ICD-10-CM

## 2021-04-24 DIAGNOSIS — I447 Left bundle-branch block, unspecified: Secondary | ICD-10-CM

## 2021-07-11 IMAGING — MR MRI BRAIN WITHOUT AND WITH CONTRAST
6 series · 48 of 48 positions shown · IV contrast (agent unspecified)
Comparison: None.

﻿ EXAM: MRI SCAN OF THE BRAIN WITHOUT AND WITH IV CONTRAST
INDICATION: Gait issues. Weakness in limbs.
TECHNIQUE: Multisequential and multiplanar images of the brain were submitted for review without and with administration of 7cc Gadovist intravenous contrast.

[Series 5: c-s-t scano · coronal · 10.0mm · 0.94mm/px · 1 of 5 slices shown]
[im 1/5]
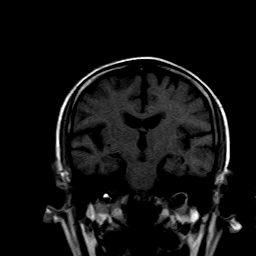

[Series 8: T1 · axial · 5.0mm · 0.94mm/px · z∈[+56,+95]mm · 3 of 8 slices shown (1 of 4)]
[im 1/8]
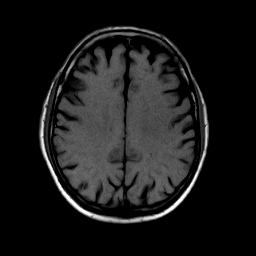
[im 4/8]
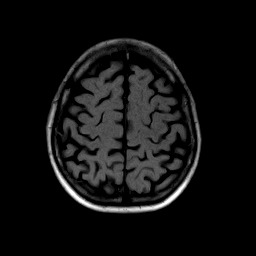
[im 8/8]
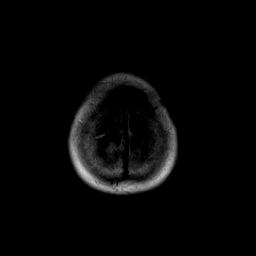

[Series 9: T2 · coronal · 5.0mm · 0.94mm/px · 11 of 24 slices shown]
[im 1/24]
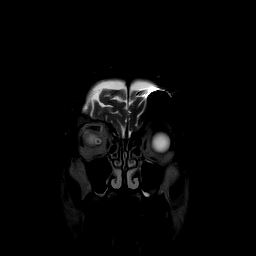
[im 3/24]
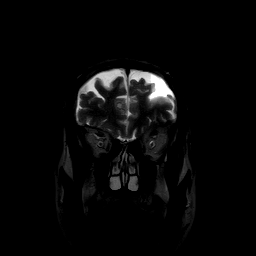
[im 5/24]
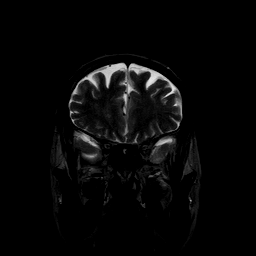
[im 7/24]
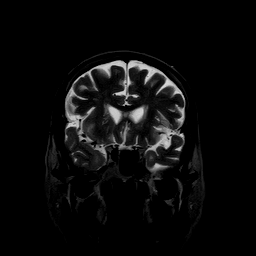
[im 10/24]
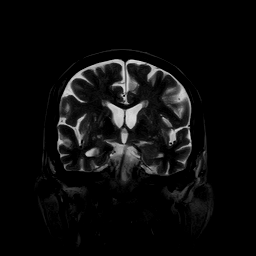
[im 12/24]
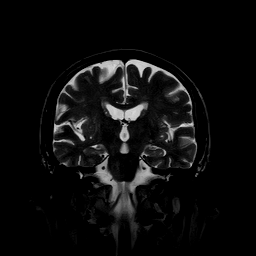
[im 14/24]
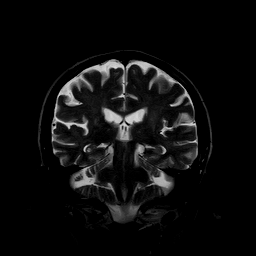
[im 17/24]
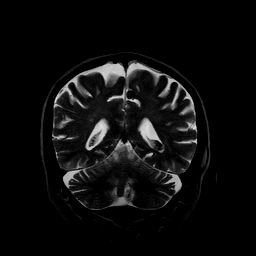
[im 19/24]
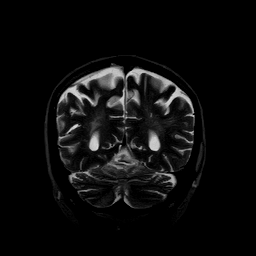
[im 21/24]
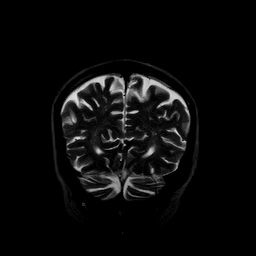
[im 24/24]
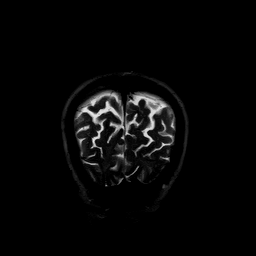

[Series 10: T1 · coronal · 5.0mm · 0.94mm/px · 11 of 24 slices shown (2 of 4)]
[im 1/24]
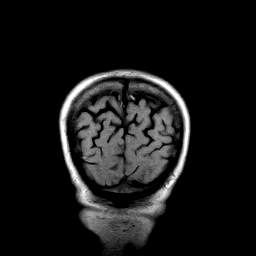
[im 3/24]
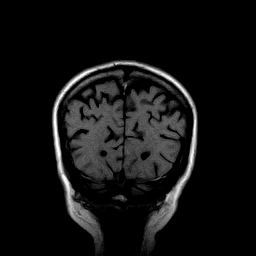
[im 5/24]
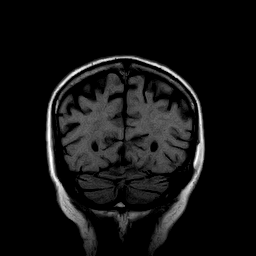
[im 7/24]
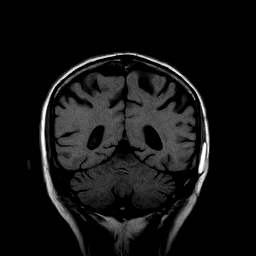
[im 10/24]
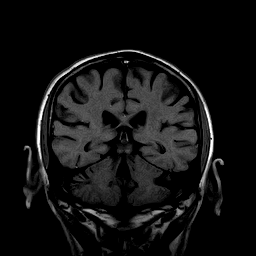
[im 12/24]
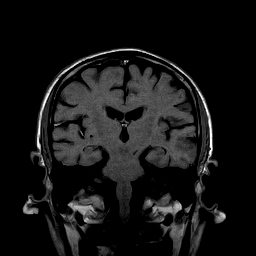
[im 14/24]
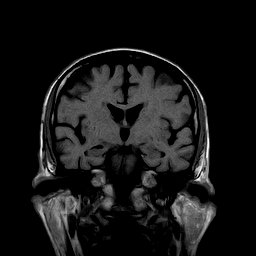
[im 17/24]
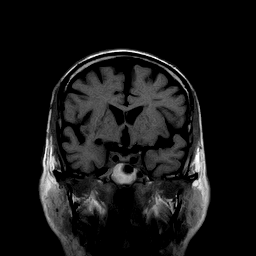
[im 19/24]
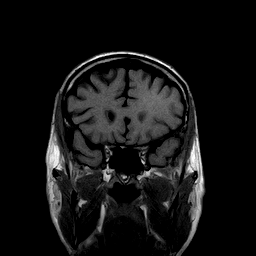
[im 21/24]
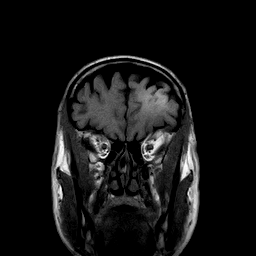
[im 24/24]
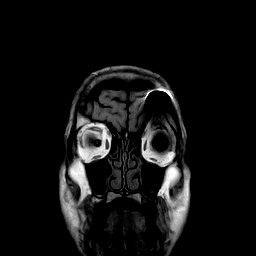

[Series 11: T1 · coronal · 5.0mm · 0.94mm/px · 11 of 24 slices shown (3 of 4)]
[im 1/24]
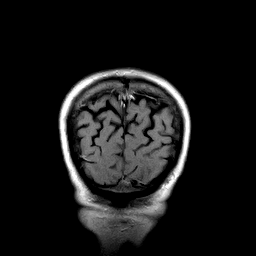
[im 3/24]
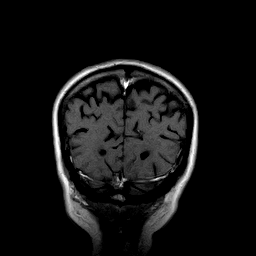
[im 5/24]
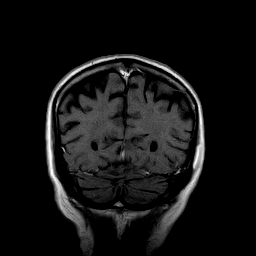
[im 7/24]
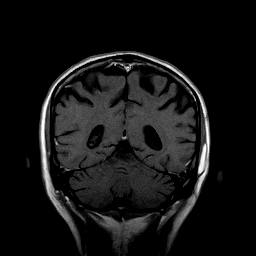
[im 10/24]
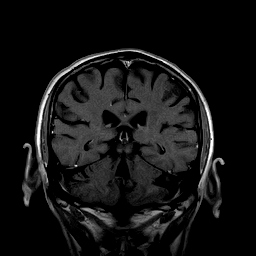
[im 12/24]
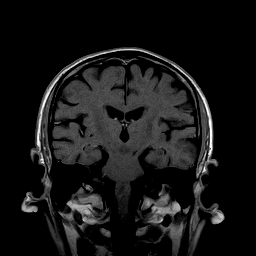
[im 14/24]
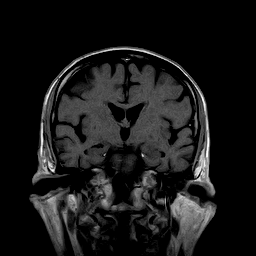
[im 17/24]
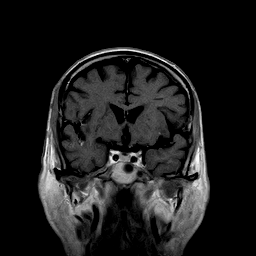
[im 19/24]
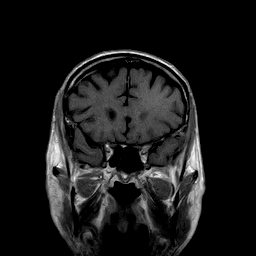
[im 21/24]
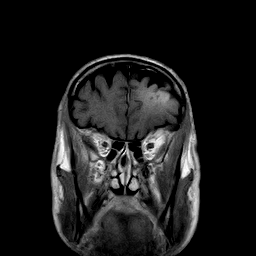
[im 24/24]
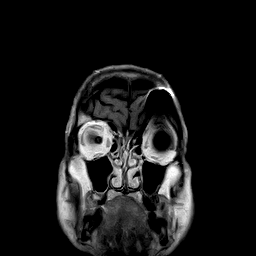

[Series 12: T1 · axial · 5.0mm · 0.94mm/px · z∈[-33,+95]mm · 11 of 24 slices shown (4 of 4)]
[im 1/24]
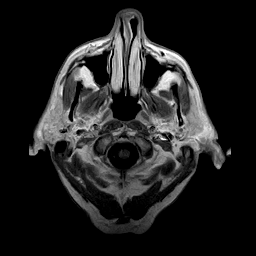
[im 3/24]
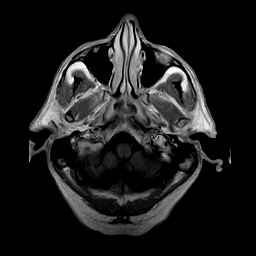
[im 5/24]
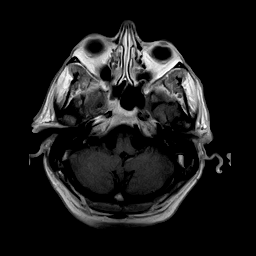
[im 7/24]
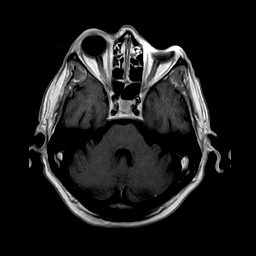
[im 10/24]
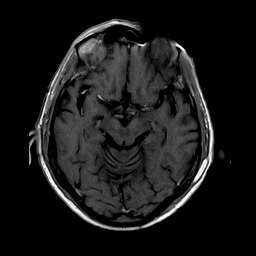
[im 12/24]
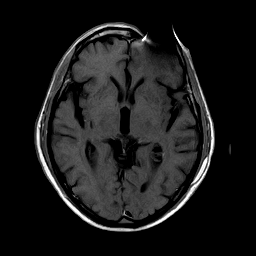
[im 14/24]
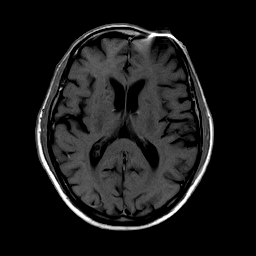
[im 17/24]
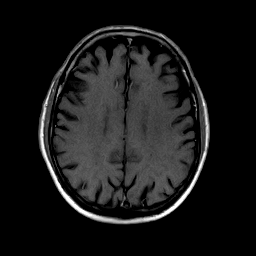
[im 19/24]
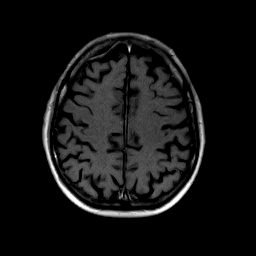
[im 21/24]
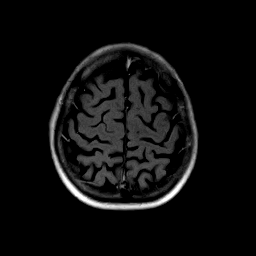
[im 24/24]
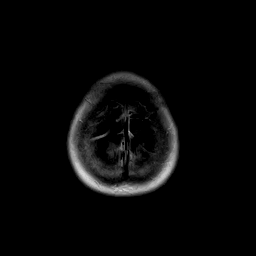

[48 of 48 positions shown; findings below may reference images not displayed]

FINDINGS: Metallic susceptibility artifact is seen in the left orbit and frontal lobe. 

Mild generalized parenchymal atrophy is appreciated. Scattered foci of increased T2/FLAIR signal abnormality are identified within the bilateral periventricular and subcortical white matter, and are most commonly associated with chronic small vessel ischemic disease. No intracranial hemorrhage, mass effect, midline shift, extra-axial collection, or hydrocephalus is identified. No pathological enhancement is noted on the postcontrast sequences. Ventricles, sulci, and basal cisterns are symmetric in configuration. 

Midline structures including the pituitary gland, corpus callosum, pineal region, and brainstem are unremarkable. The craniovertebral junction is within normal limits. 

A 2.9 x 0.5 x 2.9 cm (AP x T x CC) fat intensity lesion is seen in the left temporal scalp (series 8 image 11). No calvarial abnormalities are identified. Minimal mucosal thickening is seen in bilateral maxillary sinuses. The mastoid air cells are clear. Orbital structures are unremarkable. Appropriate flow voids are present in the visualized intracranial vessels.
IMPRESSION: 1. No acute intracranial pathology, space occupying mass, or pathological enhancement is demonstrated.

2. Mild generalized parenchymal atrophy and chronic small vessel ischemic disease.

3. Minimal mucosal thickening in bilateral maxillary sinuses.

4. 2.9 x 0.5 x 2.9 cm fat intensity lesion in the left temporal scalp. This likely reflects a lipoma.

## 2022-01-24 IMAGING — MR MRI LUMBAR SPINE WITHOUT CONTRAST
2 series · 10 of 10 positions shown · non-contrast
Comparison: none

﻿ MRI OF THE LUMBAR SPINE:
TECHNIQUE: Magnetic resonance imaging was performed of the lumbar spine utilizing axial, coronal and sagittal imaging techniques. This is the first time I am seeing this exam and the first time this exam was made available for me to review.  This is correlated to the patient's prior lumbar spine MRI dated 02/20/2021.
INDICATION: Evaluate for L5 radiculopathy. Degenerative disc disease.

[Series 4: T1 · sagittal · 4.0mm · 1.17mm/px · 5 of 5 slices shown (1 of 2)]
[im 1/5]
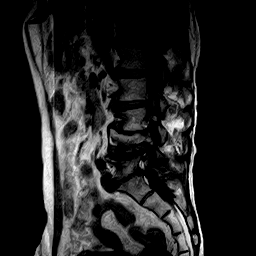
[im 2/5]
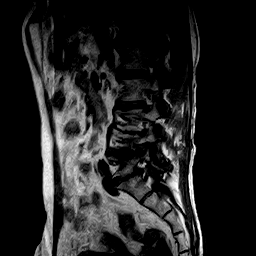
[im 3/5]
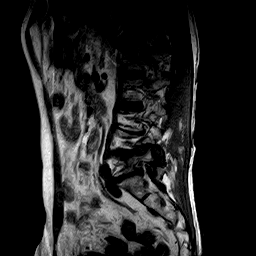
[im 4/5]
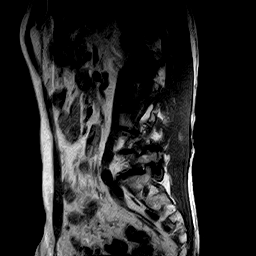
[im 5/5]
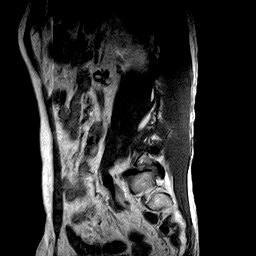

[Series 9: T1 · axial · 4.0mm · 0.98mm/px · z∈[-59,-42]mm · 5 of 5 slices shown (2 of 2)]
[im 1/5]
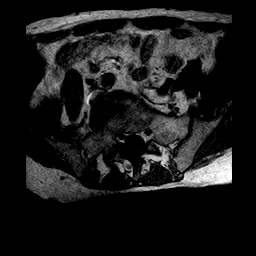
[im 2/5]
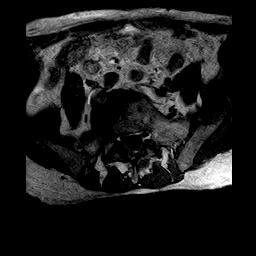
[im 3/5]
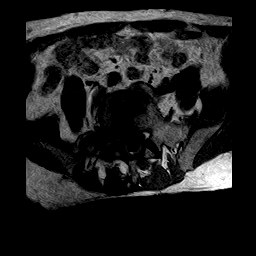
[im 4/5]
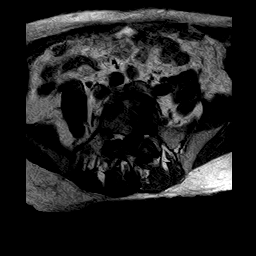
[im 5/5]
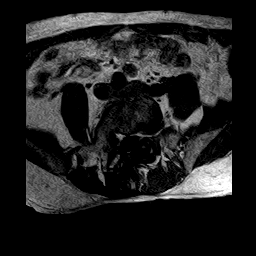

[10 of 10 positions shown; findings below may reference images not displayed]

FINDINGS: There is no abnormal signal evident to suggest fracture. Curvature lumbar spine convex to the right apex at L3/4 is evident which appears unchanged.  Pars interarticularis appear intact. Conus medullaris terminates at the level of L1 and demonstrates normal size and signal.  Loss of disc signal is evident throughout the lumbar spine with levels of disc space narrowing, endplate remodeling and endplate hypertrophy as well as facet hypertrophy and ligamentum flavum hypertrophy all of which appears unchanged from the prior exam.

L1/2: Diffuse disc bulge.  No central canal stenosis.  No neural foraminal narrowing.  No change. Evaluation is limited to the lack of axial imaging through this level.  No change.

L2/3:  Right foraminal/extraforaminal disc herniation seen axial series 8, image 3 and sagittal series 7 image 11. This is superimposed upon diffuse disc bulge.  Central canal stenosis asymmetric to the right. Impingement intrathecal right L3 nerve root.  Bilateral neural foraminal narrowing right more than left. Impingement right foraminal/extraforaminal L2 nerve root.  No change. 

L3/4:  Diffuse disc bulge.  Central canal stenosis. Bilateral neural foraminal narrowing left more than right. Impingement bilateral foraminal L3 nerve roots left more than right.  No change.

L4/5:  Diffuse disc bulge.  Central canal stenosis. Impingement bilateral intrathecal L5 nerve roots. Bilateral neural foraminal narrowing right more than left.  Impingement bilateral foraminal L4 nerve roots right more than left.  No change.

L5/S1:  Diffuse disc bulge asymmetric to the right.  Central canal stenosis asymmetric to the right. Impingement right S1 nerve root. Bilateral neural foraminal narrowing right more than left. Impingement bilateral foraminal L5 nerve roots right more than left.  No change.

The visualized paraspinal soft tissues are grossly unremarkable.
IMPRESSION: 1. Curvature lumbar spine convex to the right; unchanged. 

2. Disc bulge at L1/2; unchanged and limitation as above. 

3. Right foraminal/extraforaminal disc herniation superimposed upon disc bulge, central canal stenosis asymmetric to the right, impingement intrathecal right L3 nerve root, bilateral neural foraminal narrowing right more than left and impingement right foraminal/extraforaminal L2 nerve root at L2/3; unchanged. 

4. Disc bulge, central canal stenosis, bilateral neural foraminal narrowing left more than right and impingement bilateral foraminal L3 nerve roots left more than right at L3/4; unchanged. 

5. Disc bulge, central canal stenosis, impingement bilateral intrathecal L5 nerve roots, bilateral neural foraminal narrowing right more than left and impingement bilateral foraminal L4 nerve roots right more than left at L4/5; unchanged. 

6. Disc bulge asymmetric to the right, central canal stenosis asymmetric to the right, impingement right S1 nerve root, bilateral neural foraminal narrowing right more than left and impingement bilateral foraminal L5 nerve roots right more than left at L5/S1; unchanged.

## 2022-03-01 IMAGING — MR MRI CERVICAL SPINE WITHOUT AND WITH CONTRAST
5 series · 48 of 48 positions shown · IV contrast (agent unspecified)
Comparison: none

﻿ MR Cervical Spine without and with IV contrast
INDICATION: Neck pain.
TECHNIQUE: Multisequence multiplanar MRI of the cervical spine without and with IV contrast was performed on a low field strength open MRI scanner using standard protocol including sagittal T1 and T2; axial T1 and gradient echo; coronal T2 sequences, followed by postcontrast sagittal and axial T1-weighted sequences after initiation of IV contrast

[Series 6: scano sag/cor · coronal · 6.0mm · 1.02mm/px · 3 of 5 slices shown]
[im 1/5]
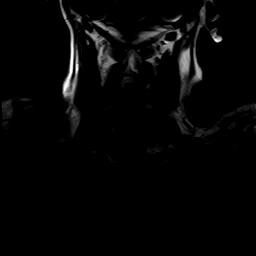
[im 3/5]
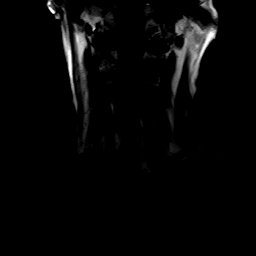
[im 5/5]
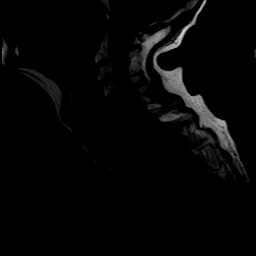

[Series 10: T1 · sagittal · 3.5mm · 0.94mm/px · 6 of 11 slices shown (1 of 3)]
[im 1/11]
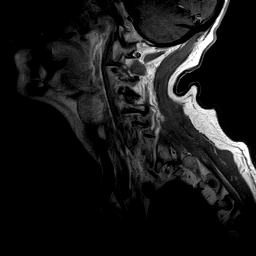
[im 3/11]
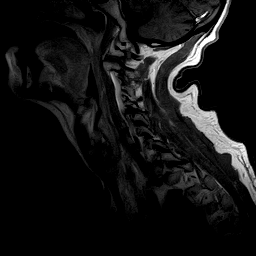
[im 5/11]
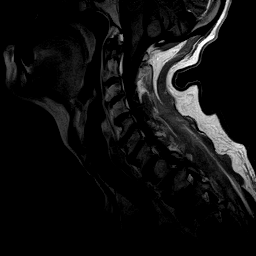
[im 7/11]
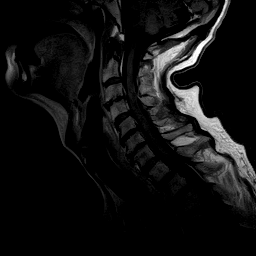
[im 9/11]
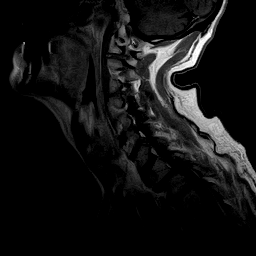
[im 11/11]
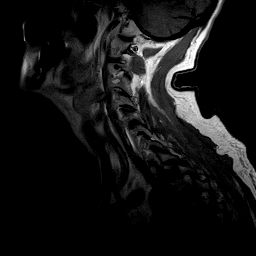

[Series 11: T1 · axial · 3.5mm · 0.78mm/px · z∈[-57,+37]mm · 13 of 24 slices shown (2 of 3)]
[im 1/24]
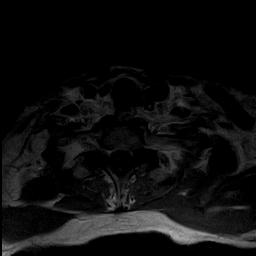
[im 2/24]
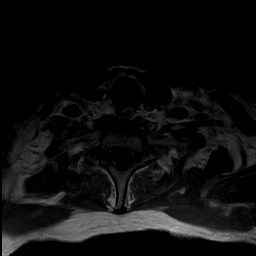
[im 4/24]
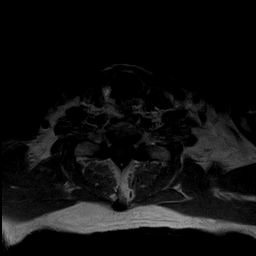
[im 6/24]
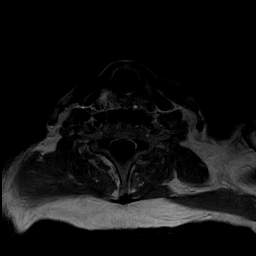
[im 8/24]
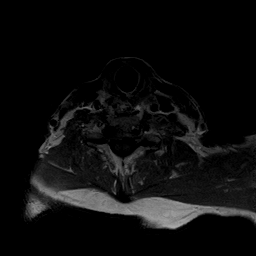
[im 10/24]
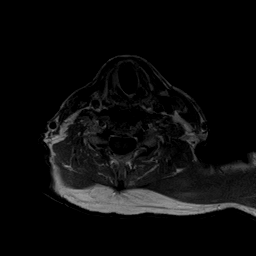
[im 12/24]
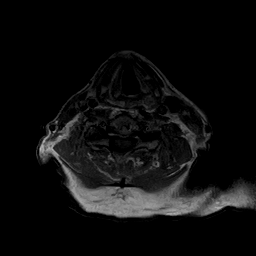
[im 14/24]
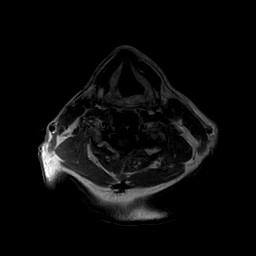
[im 16/24]
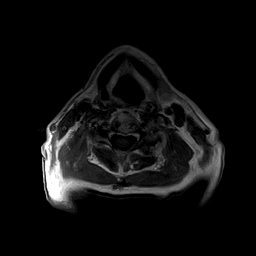
[im 18/24]
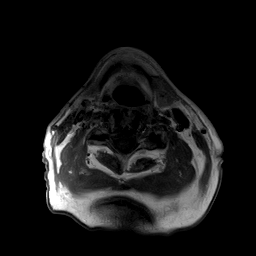
[im 20/24]
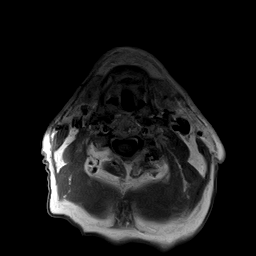
[im 22/24]
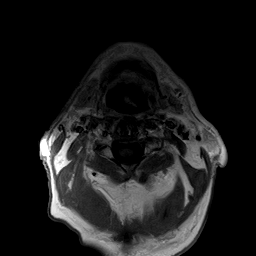
[im 24/24]
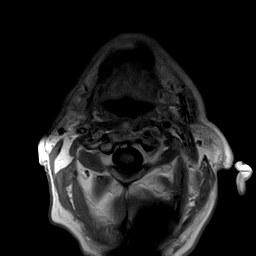

[Series 12: ge trs w/mtc · axial · 3.5mm · 0.78mm/px · z∈[-57,+37]mm · 13 of 24 slices shown]
[im 1/24]
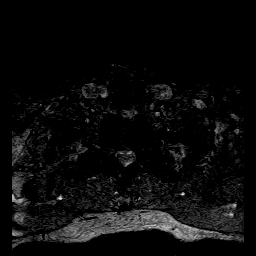
[im 2/24]
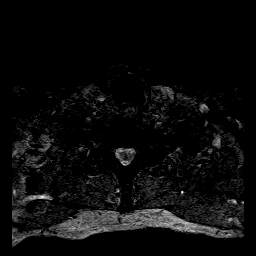
[im 4/24]
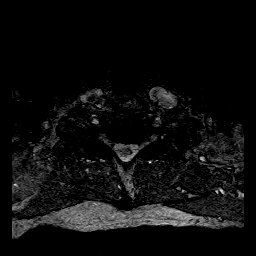
[im 6/24]
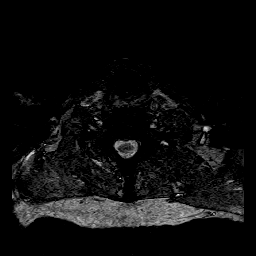
[im 8/24]
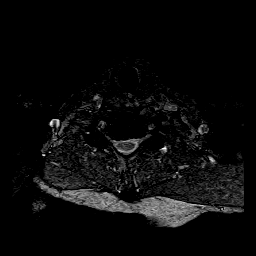
[im 10/24]
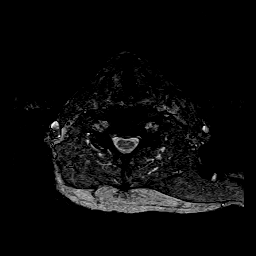
[im 12/24]
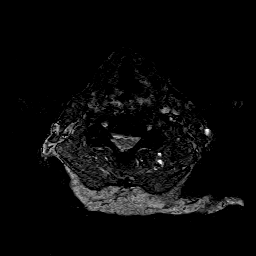
[im 14/24]
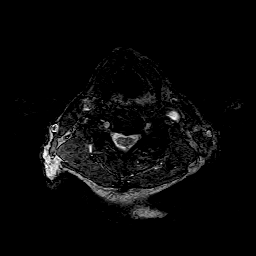
[im 16/24]
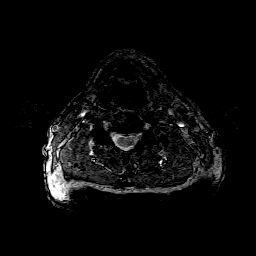
[im 18/24]
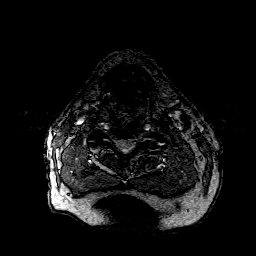
[im 20/24]
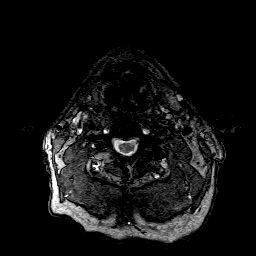
[im 22/24]
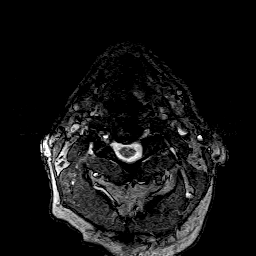
[im 24/24]
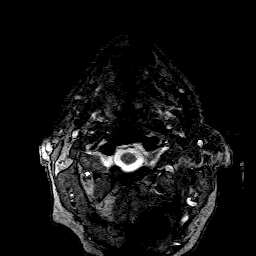

[Series 13: T1 · axial · 3.5mm · 0.78mm/px · z∈[-57,+37]mm · 13 of 24 slices shown (3 of 3)]
[im 1/24]
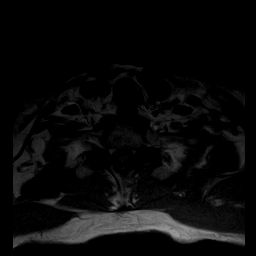
[im 2/24]
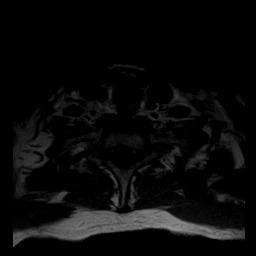
[im 4/24]
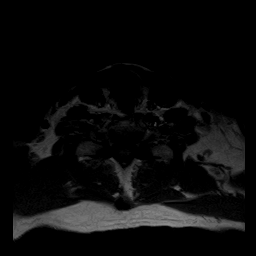
[im 6/24]
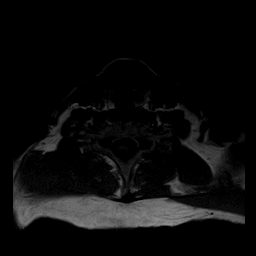
[im 8/24]
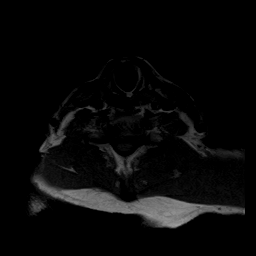
[im 10/24]
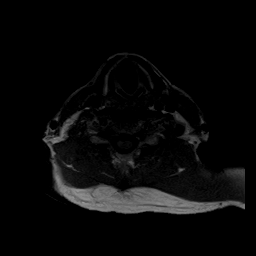
[im 12/24]
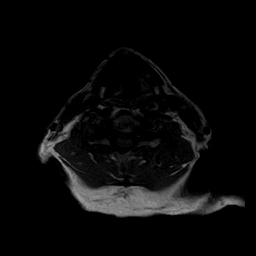
[im 14/24]
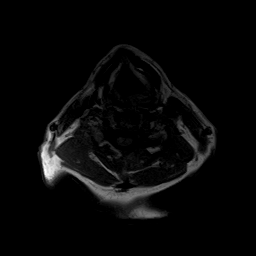
[im 16/24]
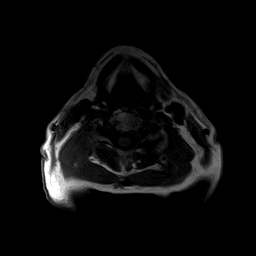
[im 18/24]
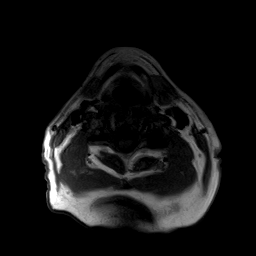
[im 20/24]
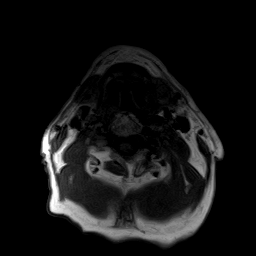
[im 22/24]
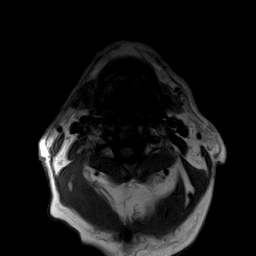
[im 24/24]
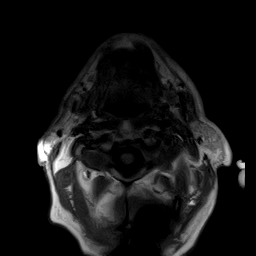

[48 of 48 positions shown; findings below may reference images not displayed]

FINDINGS: Visualized portions of the posterior fossa are unremarkable. The bone marrow and cord signal appears normal. There are no masses in the central canal or paraspinal region seen. Prevertebral soft tissues are normal. The atlanto-dens interval is intact. The craniocervical junction appears normal. Cervical lordosis and alignment is maintained. Specific findings are seen at the following levels:

C2-C3: Disc osteophyte complex.  No disc herniation, central canal stenosis, or neuroforaminal narrowing.

C3-C4: Disc osteophyte complex with posterior ridging effacing the ventral thecal sac resulting in mild central canal stenosis.  Uncovertebral joint hypertrophy and facet arthrosis contribute to mild bilateral neuroforaminal narrowing.

C4-C5:  Degenerative disc desiccation and osteophyte formation.  Disc osteophyte complex with posterior ridging contributes to mild central canal stenosis.  Uncovertebral joint hypertrophy and facet arthrosis contribute to mild right and severe left-sided neuroforaminal narrowing.

C5-C6: Degenerative disc desiccation and osteophyte formation.  Disc osteophyte complex with posterior ridging contributing to mild central canal stenosis.  Uncovertebral joint hypertrophy contributes to mild right and moderate left-sided neuroforaminal narrowing.

C6-C7: Moderate intervertebral disc space narrowing with disc desiccation and osteophyte formation.  Disc osteophyte complex.  No central canal stenosis.  Uncovertebral hypertrophy contributes to moderate bilateral neuroforaminal narrowing.

C7-T1: Disc osteophyte complex.  No disc herniation, central canal stenosis, or neuroforaminal narrowing.
IMPRESSION: 1. Degenerative disease at C3-4 with mild central canal stenosis and mild bilateral neuroforaminal narrowing.

2. Degenerative disc disease at C4-5 with mild central canal stenosis as well as mild right and severe left-sided neuroforaminal narrowing.

3. Degenerative disc disease at C5-6 with mild central canal stenosis as well as mild right and moderate left-sided neuroforaminal narrowing.

4. Degenerative disease at C6-7 with moderate bilateral neuroforaminal narrowing.

## 2022-12-07 ENCOUNTER — Telehealth (INDEPENDENT_AMBULATORY_CARE_PROVIDER_SITE_OTHER): Payer: Self-pay

## 2022-12-07 NOTE — Telephone Encounter (Signed)
Patient called in to RN triage line stating he would like to confirm what his last dose of Benicar was per last OV note. Relayed Benicar rx instructions per last note. Patient verbalized understanding of all.
